# Patient Record
Sex: Male | Born: 1985 | Race: White | Hispanic: No | Marital: Single | Smoking: Former smoker
Health system: Southern US, Community
[De-identification: ages and names within clinical notes are randomized; demographics above are authoritative.]

## PROBLEM LIST (undated history)

## (undated) DIAGNOSIS — I319 Disease of pericardium, unspecified: Secondary | ICD-10-CM

## (undated) HISTORY — PX: TONSILLECTOMY: SUR1361

## (undated) HISTORY — PX: COSMETIC SURGERY: SHX468

## (undated) HISTORY — DX: Disease of pericardium, unspecified: I31.9

## (undated) HISTORY — PX: APPENDECTOMY: SHX54

---
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
## 2009-03-12 ENCOUNTER — Encounter: Payer: Self-pay | Admitting: Cardiovascular Disease

## 2009-03-12 ENCOUNTER — Inpatient Hospital Stay (HOSPITAL_COMMUNITY): Admission: EM | Admit: 2009-03-12 | Discharge: 2009-03-13 | Payer: Self-pay | Admitting: Emergency Medicine

## 2009-05-27 ENCOUNTER — Observation Stay (HOSPITAL_COMMUNITY): Admission: EM | Admit: 2009-05-27 | Discharge: 2009-05-28 | Payer: Self-pay | Admitting: Emergency Medicine

## 2009-05-28 ENCOUNTER — Encounter (INDEPENDENT_AMBULATORY_CARE_PROVIDER_SITE_OTHER): Payer: Self-pay | Admitting: Cardiovascular Disease

## 2009-05-30 ENCOUNTER — Ambulatory Visit: Payer: Self-pay | Admitting: Cardiology

## 2009-05-30 ENCOUNTER — Encounter (INDEPENDENT_AMBULATORY_CARE_PROVIDER_SITE_OTHER): Payer: Self-pay | Admitting: Internal Medicine

## 2009-05-30 ENCOUNTER — Inpatient Hospital Stay (HOSPITAL_COMMUNITY): Admission: EM | Admit: 2009-05-30 | Discharge: 2009-06-03 | Payer: Self-pay | Admitting: Emergency Medicine

## 2009-05-31 ENCOUNTER — Ambulatory Visit: Payer: Self-pay | Admitting: Cardiothoracic Surgery

## 2009-05-31 ENCOUNTER — Encounter (INDEPENDENT_AMBULATORY_CARE_PROVIDER_SITE_OTHER): Payer: Self-pay | Admitting: Cardiology

## 2009-05-31 ENCOUNTER — Encounter (INDEPENDENT_AMBULATORY_CARE_PROVIDER_SITE_OTHER): Payer: Self-pay | Admitting: Emergency Medicine

## 2009-05-31 ENCOUNTER — Encounter: Payer: Self-pay | Admitting: Cardiothoracic Surgery

## 2009-05-31 ENCOUNTER — Ambulatory Visit: Payer: Self-pay | Admitting: Infectious Diseases

## 2009-06-14 ENCOUNTER — Encounter: Admission: RE | Admit: 2009-06-14 | Discharge: 2009-06-14 | Payer: Self-pay | Admitting: Cardiothoracic Surgery

## 2009-06-14 ENCOUNTER — Ambulatory Visit: Payer: Self-pay | Admitting: Cardiothoracic Surgery

## 2009-09-28 HISTORY — PX: OTHER SURGICAL HISTORY: SHX169

## 2011-01-02 LAB — BASIC METABOLIC PANEL
BUN: 11 mg/dL (ref 6–23)
BUN: 11 mg/dL (ref 6–23)
BUN: 9 mg/dL (ref 6–23)
CO2: 25 mEq/L (ref 19–32)
CO2: 29 mEq/L (ref 19–32)
CO2: 29 mEq/L (ref 19–32)
CO2: 32 mEq/L (ref 19–32)
Calcium: 8 mg/dL — ABNORMAL LOW (ref 8.4–10.5)
Calcium: 8.2 mg/dL — ABNORMAL LOW (ref 8.4–10.5)
Calcium: 8.5 mg/dL (ref 8.4–10.5)
Calcium: 9 mg/dL (ref 8.4–10.5)
Chloride: 100 mEq/L (ref 96–112)
Chloride: 102 mEq/L (ref 96–112)
Chloride: 93 mEq/L — ABNORMAL LOW (ref 96–112)
Chloride: 98 mEq/L (ref 96–112)
Creatinine, Ser: 0.6 mg/dL (ref 0.4–1.5)
Creatinine, Ser: 0.6 mg/dL (ref 0.4–1.5)
Creatinine, Ser: 0.62 mg/dL (ref 0.4–1.5)
Creatinine, Ser: 0.67 mg/dL (ref 0.4–1.5)
GFR calc Af Amer: >60 mL/min (ref >60)
GFR calc Af Amer: >60 mL/min (ref >60)
GFR calc Af Amer: >60 mL/min (ref >60)
GFR calc non Af Amer: >60 mL/min (ref >60)
GFR calc non Af Amer: >60 mL/min (ref >60)
GFR calc non Af Amer: >60 mL/min (ref >60)
Glucose, Bld: 100 mg/dL — ABNORMAL HIGH (ref 70–99)
Glucose, Bld: 105 mg/dL — ABNORMAL HIGH (ref 70–99)
Glucose, Bld: 125 mg/dL — ABNORMAL HIGH (ref 70–99)
Glucose, Bld: 92 mg/dL (ref 70–99)
Potassium: 3.7 mEq/L (ref 3.5–5.1)
Potassium: 3.8 mEq/L (ref 3.5–5.1)
Sodium: 135 mEq/L (ref 135–145)
Sodium: 136 mEq/L (ref 135–145)

## 2011-01-02 LAB — CBC
HCT: 35.5 % — ABNORMAL LOW (ref 39.0–52.0)
HCT: 39.3 % (ref 39.0–52.0)
Hemoglobin: 12.2 g/dL — ABNORMAL LOW (ref 13.0–17.0)
Hemoglobin: 13.3 g/dL (ref 13.0–17.0)
Hemoglobin: 13.8 g/dL (ref 13.0–17.0)
MCHC: 34.1 g/dL (ref 30.0–36.0)
MCV: 90.5 fL (ref 78.0–100.0)
MCV: 90.6 fL (ref 78.0–100.0)
Platelets: 189 10*3/uL (ref 150–400)
Platelets: 213 10*3/uL (ref 150–400)
RBC: 4 MIL/uL — ABNORMAL LOW (ref 4.22–5.81)
RBC: 4.35 MIL/uL (ref 4.22–5.81)
RBC: 4.45 MIL/uL (ref 4.22–5.81)
RDW: 13.3 % (ref 11.5–15.5)
RDW: 13.7 % (ref 11.5–15.5)
WBC: 12.3 10*3/uL — ABNORMAL HIGH (ref 4.0–10.5)
WBC: 7.5 10*3/uL (ref 4.0–10.5)
WBC: 8.5 10*3/uL (ref 4.0–10.5)

## 2011-01-02 LAB — BLOOD GAS, ARTERIAL
Acid-Base Excess: 3.6 mmol/L — ABNORMAL HIGH (ref 0.0–2.0)
Bicarbonate: 27.9 mEq/L — ABNORMAL HIGH (ref 20.0–24.0)
O2 Content: 0.2 L/min
O2 Saturation: 96.6 %
Patient temperature: 98.6
TCO2: 29.3 mmol/L (ref 0–100)
pCO2 arterial: 44.8 mmHg (ref 35.0–45.0)
pH, Arterial: 7.41 (ref 7.350–7.450)
pO2, Arterial: 84.2 mmHg (ref 80.0–100.0)

## 2011-01-02 LAB — COMPREHENSIVE METABOLIC PANEL
ALT: 10 U/L (ref 0–53)
ALT: 13 U/L (ref 0–53)
ALT: 14 U/L (ref 0–53)
AST: 16 U/L (ref 0–37)
AST: 16 U/L (ref 0–37)
Albumin: 3 g/dL — ABNORMAL LOW (ref 3.5–5.2)
Alkaline Phosphatase: 49 U/L (ref 39–117)
Alkaline Phosphatase: 52 U/L (ref 39–117)
BUN: 14 mg/dL (ref 6–23)
BUN: 5 mg/dL — ABNORMAL LOW (ref 6–23)
CO2: 26 mEq/L (ref 19–32)
CO2: 28 mEq/L (ref 19–32)
CO2: 29 mEq/L (ref 19–32)
Calcium: 7.8 mg/dL — ABNORMAL LOW (ref 8.4–10.5)
Calcium: 8 mg/dL — ABNORMAL LOW (ref 8.4–10.5)
Chloride: 96 mEq/L (ref 96–112)
Chloride: 99 mEq/L (ref 96–112)
Creatinine, Ser: 0.62 mg/dL (ref 0.4–1.5)
Creatinine, Ser: 0.76 mg/dL (ref 0.4–1.5)
GFR calc Af Amer: >60 mL/min (ref >60)
GFR calc Af Amer: >60 mL/min (ref >60)
GFR calc non Af Amer: >60 mL/min (ref >60)
GFR calc non Af Amer: >60 mL/min (ref >60)
Glucose, Bld: 108 mg/dL — ABNORMAL HIGH (ref 70–99)
Glucose, Bld: 149 mg/dL — ABNORMAL HIGH (ref 70–99)
Potassium: 2.8 mEq/L — ABNORMAL LOW (ref 3.5–5.1)
Potassium: 4.2 mEq/L (ref 3.5–5.1)
Sodium: 135 mEq/L (ref 135–145)
Sodium: 138 mEq/L (ref 135–145)
Sodium: 138 mEq/L (ref 135–145)
Total Bilirubin: 0.6 mg/dL (ref 0.3–1.2)
Total Bilirubin: 1.1 mg/dL (ref 0.3–1.2)
Total Protein: 6.1 g/dL (ref 6.0–8.3)
Total Protein: 6.3 g/dL (ref 6.0–8.3)
Total Protein: 6.5 g/dL (ref 6.0–8.3)

## 2011-01-02 LAB — DIFFERENTIAL
Basophils Absolute: 0 10*3/uL (ref 0.0–0.1)
Basophils Relative: 0 % (ref 0–1)
Lymphocytes Relative: 20 % (ref 12–46)
Monocytes Relative: 10 % (ref 3–12)
Neutro Abs: 8.6 10*3/uL — ABNORMAL HIGH (ref 1.7–7.7)
Neutrophils Relative %: 70 % (ref 43–77)

## 2011-01-02 LAB — AFB CULTURE WITH SMEAR (NOT AT ARMC)
Acid Fast Smear: NONE SEEN
Acid Fast Smear: NONE SEEN
Acid Fast Smear: NONE SEEN

## 2011-01-02 LAB — FUNGUS CULTURE W SMEAR
Fungal Smear: NONE SEEN
Fungal Smear: NONE SEEN
Fungal Smear: NONE SEEN

## 2011-01-02 LAB — URINALYSIS, ROUTINE W REFLEX MICROSCOPIC
Glucose, UA: NEGATIVE mg/dL
pH: 5.5 (ref 5.0–8.0)

## 2011-01-02 LAB — TISSUE CULTURE
Culture: NO GROWTH
Gram Stain: NONE SEEN

## 2011-01-02 LAB — CULTURE, BLOOD (ROUTINE X 2)

## 2011-01-02 LAB — URINE MICROSCOPIC-ADD ON

## 2011-01-02 LAB — PROTIME-INR
INR: 1.2 (ref 0.00–1.49)
Prothrombin Time: 14.7 seconds (ref 11.6–15.2)

## 2011-01-02 LAB — POCT I-STAT 3, ART BLOOD GAS (G3+)
Acid-Base Excess: 1 mmol/L (ref 0.0–2.0)
Bicarbonate: 26.6 mEq/L — ABNORMAL HIGH (ref 20.0–24.0)
O2 Saturation: 97 %
Patient temperature: 97.9
TCO2: 28 mmol/L (ref 0–100)
pCO2 arterial: 43.1 mmHg (ref 35.0–45.0)
pH, Arterial: 7.396 (ref 7.350–7.450)
pO2, Arterial: 89 mmHg (ref 80.0–100.0)

## 2011-01-02 LAB — BODY FLUID CULTURE
Culture: NO GROWTH
Culture: NO GROWTH
Gram Stain: NONE SEEN
Gram Stain: NONE SEEN

## 2011-01-02 LAB — ANAEROBIC CULTURE
Gram Stain: NONE SEEN
Gram Stain: NONE SEEN

## 2011-01-02 LAB — HEPATITIS PANEL, ACUTE
HCV Ab: NEGATIVE
HCV Ab: NEGATIVE
Hep A IgM: NEGATIVE
Hep B C IgM: NEGATIVE
Hep B C IgM: NEGATIVE
Hepatitis B Surface Ag: NEGATIVE
Hepatitis B Surface Ag: NEGATIVE

## 2011-01-02 LAB — SEDIMENTATION RATE: Sed Rate: 30 mm/hr — ABNORMAL HIGH (ref 0–16)

## 2011-01-02 LAB — LACTIC ACID, PLASMA: Lactic Acid, Venous: 1.8 mmol/L (ref 0.5–2.2)

## 2011-01-02 LAB — COXSACKIE A VIRUS ANTIBODIES: Coxsackie A Sero 9: 1:8 {titer}

## 2011-01-02 LAB — ECHOVIRUS ABS PANEL (CSF)
Echovirus Ab Type 11: <1:10 {titer}
Echovirus Ab Type 30: <1:10 {titer}
Echovirus Ab Type 6: <1:10 {titer}
Echovirus Ab Type 7: <1:10 {titer}
Echovirus Ab Type 9: 1:20 {titer}

## 2011-01-02 LAB — MAGNESIUM: Magnesium: 2.2 mg/dL (ref 1.5–2.5)

## 2011-01-02 LAB — COMPREHENSIVE METABOLIC PANEL WITH GFR
AST: 24 U/L (ref 0–37)
Albumin: 3 g/dL — ABNORMAL LOW (ref 3.5–5.2)
Alkaline Phosphatase: 54 U/L (ref 39–117)
Chloride: 100 meq/L (ref 96–112)
GFR calc Af Amer: >60 mL/min (ref >60)
Potassium: 3.5 meq/L (ref 3.5–5.1)
Total Bilirubin: 1.1 mg/dL (ref 0.3–1.2)
Total Protein: 6.1 g/dL (ref 6.0–8.3)

## 2011-01-02 LAB — CK TOTAL AND CKMB (NOT AT ARMC)
CK, MB: 1 ng/mL (ref 0.3–4.0)
CK, MB: 2.1 ng/mL (ref 0.3–4.0)
Total CK: 395 U/L — ABNORMAL HIGH (ref 7–232)
Total CK: 604 U/L — ABNORMAL HIGH (ref 7–232)
Total CK: 659 U/L — ABNORMAL HIGH (ref 7–232)

## 2011-01-02 LAB — ANTI-DNA ANTIBODY, DOUBLE-STRANDED: ds DNA Ab: <1 IU/mL (ref <5)

## 2011-01-02 LAB — TYPE AND SCREEN
ABO/RH(D): O NEG
Antibody Screen: NEGATIVE

## 2011-01-02 LAB — EPSTEIN-BARR VIRUS VCA ANTIBODY PANEL: EBV VCA IgM: 0.07 {ISR}

## 2011-01-02 LAB — ABO/RH: ABO/RH(D): O NEG

## 2011-01-02 LAB — MISCELLANEOUS TEST

## 2011-01-02 LAB — CMV ABS, IGG+IGM (CYTOMEGALOVIRUS)
CMV IgM: <8 AU/mL (ref <30.0)
Cytomegalovirus Ab-IgG: <0.2 U/mL (ref <0.4)

## 2011-01-02 LAB — ANA
Anti Nuclear Antibody(ANA): NEGATIVE
Anti Nuclear Antibody(ANA): NEGATIVE

## 2011-01-02 LAB — APTT: aPTT: 33 seconds (ref 24–37)

## 2011-01-02 LAB — TROPONIN I: Troponin I: 0.02 ng/mL (ref 0.00–0.06)

## 2011-01-02 LAB — BRAIN NATRIURETIC PEPTIDE: Pro B Natriuretic peptide (BNP): 91 pg/mL (ref 0.0–100.0)

## 2011-01-03 LAB — SEDIMENTATION RATE: Sed Rate: 6 mm/hr (ref 0–16)

## 2011-01-03 LAB — ANA: Anti Nuclear Antibody(ANA): NEGATIVE

## 2011-01-03 LAB — LIPID PANEL
Cholesterol: 95 mg/dL (ref 0–200)
LDL Cholesterol: 52 mg/dL (ref 0–99)
Triglycerides: 35 mg/dL (ref <150)

## 2011-01-03 LAB — CULTURE, BLOOD (ROUTINE X 2)

## 2011-01-03 LAB — DIFFERENTIAL
Basophils Absolute: 0.1 10*3/uL (ref 0.0–0.1)
Basophils Relative: 1 % (ref 0–1)
Eosinophils Relative: 0 % (ref 0–5)
Lymphocytes Relative: 21 % (ref 12–46)
Lymphocytes Relative: 22 % (ref 12–46)
Lymphs Abs: 2.4 10*3/uL (ref 0.7–4.0)
Monocytes Relative: 8 % (ref 3–12)
Neutro Abs: 7.9 10*3/uL — ABNORMAL HIGH (ref 1.7–7.7)
Neutrophils Relative %: 68 % (ref 43–77)

## 2011-01-03 LAB — BASIC METABOLIC PANEL
BUN: 13 mg/dL (ref 6–23)
CO2: 28 mEq/L (ref 19–32)
Chloride: 100 mEq/L (ref 96–112)
Creatinine, Ser: 0.73 mg/dL (ref 0.4–1.5)
Glucose, Bld: 127 mg/dL — ABNORMAL HIGH (ref 70–99)
Potassium: 3.7 mEq/L (ref 3.5–5.1)

## 2011-01-03 LAB — CBC
HCT: 38.4 % — ABNORMAL LOW (ref 39.0–52.0)
HCT: 44.2 % (ref 39.0–52.0)
Hemoglobin: 13.4 g/dL (ref 13.0–17.0)
Hemoglobin: 15 g/dL (ref 13.0–17.0)
MCV: 89.4 fL (ref 78.0–100.0)
Platelets: 146 10*3/uL — ABNORMAL LOW (ref 150–400)
Platelets: 160 10*3/uL (ref 150–400)
RBC: 4.87 MIL/uL (ref 4.22–5.81)
RDW: 13.1 % (ref 11.5–15.5)
WBC: 11.4 10*3/uL — ABNORMAL HIGH (ref 4.0–10.5)
WBC: 11.5 10*3/uL — ABNORMAL HIGH (ref 4.0–10.5)

## 2011-01-03 LAB — CARDIAC PANEL(CRET KIN+CKTOT+MB+TROPI)
Relative Index: 0.6 (ref 0.0–2.5)
Troponin I: 0.02 ng/mL (ref 0.00–0.06)

## 2011-01-03 LAB — COMPREHENSIVE METABOLIC PANEL
Albumin: 3.3 g/dL — ABNORMAL LOW (ref 3.5–5.2)
Alkaline Phosphatase: 48 U/L (ref 39–117)
BUN: 9 mg/dL (ref 6–23)
Chloride: 107 mEq/L (ref 96–112)
Creatinine, Ser: 0.81 mg/dL (ref 0.4–1.5)
Glucose, Bld: 141 mg/dL — ABNORMAL HIGH (ref 70–99)
Potassium: 3.3 mEq/L — ABNORMAL LOW (ref 3.5–5.1)
Total Bilirubin: 1.1 mg/dL (ref 0.3–1.2)

## 2011-01-03 LAB — URINALYSIS, ROUTINE W REFLEX MICROSCOPIC
Glucose, UA: NEGATIVE mg/dL
Ketones, ur: 15 mg/dL — AB
pH: 5.5 (ref 5.0–8.0)

## 2011-01-03 LAB — POCT CARDIAC MARKERS: Myoglobin, poc: 308 ng/mL (ref 12–200)

## 2011-01-03 LAB — URINE MICROSCOPIC-ADD ON

## 2011-01-05 LAB — RAPID URINE DRUG SCREEN, HOSP PERFORMED
Barbiturates: NOT DETECTED
Benzodiazepines: NOT DETECTED
Cocaine: NOT DETECTED
Opiates: POSITIVE — AB

## 2011-01-05 LAB — DIFFERENTIAL
Basophils Relative: 0 % (ref 0–1)
Eosinophils Absolute: 0 10*3/uL (ref 0.0–0.7)
Monocytes Relative: 8 % (ref 3–12)
Neutrophils Relative %: 85 % — ABNORMAL HIGH (ref 43–77)

## 2011-01-05 LAB — HEPATIC FUNCTION PANEL
ALT: 18 U/L (ref 0–53)
Alkaline Phosphatase: 75 U/L (ref 39–117)
Indirect Bilirubin: 0.7 mg/dL (ref 0.3–0.9)
Total Bilirubin: 1 mg/dL (ref 0.3–1.2)
Total Protein: 7.2 g/dL (ref 6.0–8.3)

## 2011-01-05 LAB — POCT CARDIAC MARKERS: Troponin i, poc: <0.05 ng/mL (ref 0.00–0.09)

## 2011-01-05 LAB — BASIC METABOLIC PANEL
BUN: 8 mg/dL (ref 6–23)
CO2: 27 mEq/L (ref 19–32)
Calcium: 9.4 mg/dL (ref 8.4–10.5)
Calcium: 8.5 mg/dL (ref 8.4–10.5)
Chloride: 106 mEq/L (ref 96–112)
Creatinine, Ser: 0.64 mg/dL (ref 0.4–1.5)
GFR calc Af Amer: >60 mL/min (ref >60)
GFR calc non Af Amer: >60 mL/min (ref >60)
Sodium: 139 mEq/L (ref 135–145)

## 2011-01-05 LAB — CBC
MCHC: 34.3 g/dL (ref 30.0–36.0)
MCHC: 34.3 g/dL (ref 30.0–36.0)
MCV: 89.1 fL (ref 78.0–100.0)
MCV: 88.5 fL (ref 78.0–100.0)
Platelets: 185 10*3/uL (ref 150–400)
Platelets: 181 10*3/uL (ref 150–400)
RDW: 13.2 % (ref 11.5–15.5)

## 2011-01-05 LAB — TROPONIN I: Troponin I: 0.01 ng/mL (ref 0.00–0.06)

## 2011-01-05 LAB — D-DIMER, QUANTITATIVE: D-Dimer, Quant: 0.51 ug/mL-FEU — ABNORMAL HIGH (ref 0.00–0.48)

## 2011-01-05 LAB — CK TOTAL AND CKMB (NOT AT ARMC)
CK, MB: 1.9 ng/mL (ref 0.3–4.0)
CK, MB: 1.3 ng/mL (ref 0.3–4.0)
Relative Index: INVALID (ref 0.0–2.5)
Relative Index: INVALID (ref 0.0–2.5)
Total CK: 64 U/L (ref 7–232)
Total CK: 140 U/L (ref 7–232)

## 2011-01-05 LAB — RHEUMATOID FACTOR: Rhuematoid fact SerPl-aCnc: <20 IU/mL (ref 0–20)

## 2011-02-10 NOTE — Discharge Summary (Signed)
[name], [name] NO.:  [id number]   MEDICAL RECORD NO.:  [id number]          PATIENT TYPE:  INP   LOCATION:  2031                         FACILITY:  [facility]   PHYSICIAN:  Nicki Guadalajara, M.D.     DATE OF BIRTH:  19-Jul-1986   DATE OF ADMISSION:  05/27/2009  DATE OF DISCHARGE:  05/28/2009                               DISCHARGE SUMMARY   DISCHARGE DIAGNOSES:  1. Pericarditis with pericardial effusion, small to moderate, free      flowing, anterior to the heart along the left ventricular free      wall.  There was no evidence of hemodynamic compromise.  2. Left ventricular dysfunction with ejection fraction of 40-45%.  3. Mild mitral regurgitation.  4. Urinary tract infection, placed on Cipro.  5. Fever, presumed secondary to urinary tract infection with blood      cultures pending.  6. Ureteral stone seen on KUB.  7. Hypokalemia, replaced.   DISCHARGE CONDITION:  Improved.  No further chest pain.   HISTORY OF PRESENT ILLNESS:  A 25 year old white male with history of  pectus reconstruction surgery at [facility] at age 31.  He was admitted in June  2010 with chest pain, had abnormal EKG, diagnosed with pericarditis.  His symptoms resolved with nonsteroidals.  This past Saturday, May 25, 2009, he began developing substernal chest pain, worse with deep  breath and worse lying flat.  Dr. Cleta Alberts saw him at Urgent Care and he was  given Toradol and Phenergan with drop of blood pressure from 110  systolic to 90 systolic and he was sent to the emergency room.   PAST MEDICAL HISTORY:  Otherwise negative.   OUTPATIENT MEDICATIONS:  Darvocet and colchicine p.r.n.   ALLERGIES:  No known allergies.   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   FAMILY HISTORY:  Negative for coronary.   The patient was monitored overnight.  He did develop a fever of 101.5.  UA was sent.  He had a chest x-ray prior to admission through Dr. Cleta Alberts  that was normal and urinalysis was positive for nitrites.  He was placed  on Cipro.  I do not have the urinary culture back at this time.   The patient also had 2-D echo completed and does have pericardial  effusion and EF is now 40-45%, down from 50-55%.  A low-dose ACE  inhibitor was added.  Plans were to discharge the patient on May 29, 2009, but Dr. Garen Lah talked to him and the patient preferred to be  discharged today and Dr. Garen Lah was agreeable to the plan.  He will  follow up with Dr. Tresa Endo.   DISCHARGE VITAL SIGNS:  Blood pressure was 116/72, temp was now 97,  pulse 77, respirations 20, and oxygen saturation on room air was 98%.   LABORATORY DATA:  Sed rate was 6.  ANA was negative.   Initial CBC:  Hemoglobin of 13.4, hematocrit 38.4, WBC 11.5, and  platelets 146.  On the day of discharge, WBC 11.4, hemoglobin 15,  hematocrit 44.2, and  platelets 160.   Blood cultures are pending at discharge.  Cholesterol panel:  Total  cholesterol 95, triglycerides 35, HDL 36, and LDL 52.  Basic metabolic  at discharge:  Sodium 138, potassium 3.7, chloride 100, CO2 of 28,  glucose 127, BUN 13, creatinine 0.73, and calcium 9.   Cardiac panels:  Total CK was 706, MB was negative at 4.5, and troponin  I of 0.02.   UA was orange in color, negative glucose, small bilirubin, ketone 15,  protein 30, urobilinogen was 1, and nitrites were positive and trace  leukocytes.  Microscopic UA:  Wbc 3.6, rbc 0-2, and few bacteria were  seen.   EKGs with nonspecific lateral changes.   The patient was placed on Indocin and Toradol IV on admission and  resolved his chest pain.  On the morning of May 28, 2009, he had no  complaints except for left flank pain that was very tender.  We did do a  KUB which was negative for stone.  EKG remained sinus rhythm.  A 2-D  echo was done with results as stated and he was seen and discharged by  Dr. Garen Lah on May 28, 2009, with a low-dose ACE  inhibitor,  nonsteroidals with Indocin and Darvocet p.r.n. as well as low-dose ACE  inhibitor.  Please see Med Rec for discharge medications.  He will  follow up with Dr. Tresa Endo as an outpatient and we asked him not to work  until June 04, 2009.  The office will call and give him the date and  time of the appointment.  No restrictions on diet and just to increase  his activity slowly.      Darcella Gasman. Annie Paras, N.P.    ______________________________  Nicki Guadalajara, M.D.    LRI/MEDQ  D:  05/28/2009  T:  05/29/2009  Job:  [id number]   cc:   Brett Canales A. Cleta Alberts, M.D.

## 2011-02-10 NOTE — H&P (Signed)
[name], [name]                 ACCOUNT NO.:  [id number]   MEDICAL RECORD NO.:  [id number]          PATIENT TYPE:  INP   LOCATION:  2610                         FACILITY:  [facility]   PHYSICIAN:  [name], M.D.   DATE OF BIRTH:  Feb 02, 1986   DATE OF ADMISSION:  05/29/2009  DATE OF DISCHARGE:                              HISTORY & PHYSICAL   PRIMARY CARE PHYSICIAN:  None.  The patient goes to [facility]  for medical needs when necessary.   CARDIOLOGIST:  Timothy Guadalajara, MD with [facility].   CHIEF COMPLAINT:  Abdominal pain, diffuse; nausea and vomiting,  intractable.   HISTORY OF PRESENT ILLNESS:  The patient is a 25 year old male who was  recently hospitalized on May 27, 2009, through May 28, 2009, for  treatment of pericarditis and pericardial effusion.  The patient has a  past medical history of recurrent problems with this and had a bout in  2005 and again in June 2010.  He was discharged home on a combination of  indomethacin, lisinopril for systolic dysfunction noted on two-  dimensional echocardiography, and Cipro for treatment of urinary tract  infection.  The patient developed intractable nausea and vomiting after  discharge.  There has not been any associated diarrhea.  He denies any  melena or hematochezia.  He has had some mild fever and chills.  The  patient states that he has vomited 12-15 times in the past 24 hours and  the vomiting has become bilious in nature.  He had an episode of  witnessed vomiting in the emergency department with flecks of blood.  He  has had some dyspnea and is sitting up to help with the pain and his  breathing.  Denies any cough.  Upon initial evaluation in the emergency  department, a CT scan of his abdomen and pelvis were obtained, which  showed a moderate-to-large pericardial effusion, increased significantly  since prior study, and extensive periportal edema.  The Hospitalist  Service was asked to  admit the patient for evaluation of the extensive  periportal edema with Cardiology consulting for recurrent pericarditis  with worsening pericardial effusion.   PAST MEDICAL HISTORY:  1. Recurrent pericarditis  2. Left ventricular dysfunction with an ejection fraction of 40% to      45% on two-dimensional echocardiogram done on May 28, 2009.      There was diffuse hypokinesis.  No evidence of hemodynamic      compromise or clinically significant tamponade.  3. Mild mitral valvular regurgitation.  4. Nephrolithiasis, nonobstructing.  5. Status post appendectomy.  6. Status post multiple pectus excavatum surgeries at age 14, 17, 45,      and 42.  7. Laryngotomy.  8. Tonsillectomy.   FAMILY HISTORY:  The patient's mother is alive at age 54 and has thyroid  disease.  The patient's father is alive at 17 and has asthma.  He has 2  healthy siblings.   SOCIAL HISTORY:  The patient is single and works as a Airline pilot.  He is  attempting to quit smoking and now has cut  down to one cigarette every  other day.  He drinks alcohol rarely, 1-2 beers per month.  Denies any  drug use.  He finished a sophomore year of college.   ALLERGIES:  No known drug allergies.   CURRENT MEDICATIONS:  1. Ciprofloxacin 500 mg p.o. b.i.d.  2. Indomethacin 50 mg p.o. t.i.d.  3. Lisinopril 2.5 mg p.o. daily.  4. Darvocet-N 100 one tablet p.o. q.6 h. p.r.n.   REVIEW OF SYSTEMS:  The patient reports pain from his hips to his ribs  bilaterally that he described is constant with a crampy, achy, and sharp  sensation.  A comprehensive 14-point review of systems is otherwise  negative except for the elements as noted in the HPI above.   PHYSICAL EXAMINATION:  VITAL SIGNS:  Temperature 97.4, pulse 94,  respirations 18, blood pressure 114/70, O2 saturation 92% on room air.  GENERAL:  This is an uncomfortable-appearing male in mild distress.  HEENT:  Normocephalic, atraumatic.  PERRL.  EOMI.  Oropharynx is clear.   Sclerae nonicteric.  Mucous membranes are moist.  Tympanic membranes are  normal.  NECK:  Supple, no thyromegaly, no lymphadenopathy.  There is jugular  venous distention.  CHEST:  The patient has crackles to his left base.  HEART:  Regular rate, rhythm.  No murmurs, rubs, or gallops.  Again,  there is JVD.  ABDOMEN:  Tender, mildly distended.  He does have positive bowel sounds.  EXTREMITIES:  No clubbing, edema, or cyanosis.  SKIN:  Warm and dry.  No rashes.  NEUROLOGIC:  The patient is alert and oriented x3.  Nonfocal.   DATA REVIEW:  CT scan shows a moderate-to-large pericardial effusion,  small right pleural effusions, bibasilar atelectasis, and extensive  periportal edema.   LABORATORY DATA:  Lipase is less than 10, lactic acid is 1.8.  CK is 659  with an MB of 2.1.  White blood cell count is 12.3, hemoglobin 13.8,  hematocrit 40.2, platelets 168.  Sodium is 135, potassium 3.5, chloride  100, bicarb 26, BUN 14, creatinine 0.62, glucose 108.  Liver function  studies are completely within normal limits.  Albumin is slightly low at  3.0.  Calcium is 7.8.  Urinalysis shows a specific gravity of 1.029.  There is moderate bilirubin, 40 mg/dL of ketones, negative nitrites,  trace leukocytes.  Troponin is 0.02.   ASSESSMENT/PLAN:  1. Recurrent moderate-to-severe pericardial effusion/pericarditis:      Given the patient's mild hematemesis, I will hold nonsteroidal anti-      inflammatory medications for now and leave the decision as to      whether or not to consider steroids to Cardiology.  The patient may      need a pericardiocentesis.  There is no evidence of tamponade at      the present time.  2. Intractable nausea and vomiting:  This could be due to passive      liver congestion versus gastritis from nonsteroidal anti-      inflammatory use.  At this point, we will hold the nonsteroidals      and I have given him a one-time dose of Lasix to see if this      improves his  symptoms.  The patient has no known history of liver      disease.  He does not have any elevation of his liver function      studies.  3. Periportal edema:  Uncertain etiology.  Again, passive congestion      of  the liver is in the differential along with hepatitis.  Given      the fact that he has no elevation of liver function studies,      hepatitis is not likely.  Nevertheless, an acute hepatitis panel      has been sent.  We will get a right upper quadrant ultrasound to      more fully evaluate.  4. Mild hematemesis:  Likely due to a small Mallory Weiss tear given      the severity of his nausea and vomiting.  Place the patient on      proton pump inhibitor therapy and hold the nonsteroidals for now.      We will check a hemoglobin and hematocrit in the morning to ensure      that he is not experiencing any significant decrease blood volume.  5. Prophylaxis:  We will use PAS hoses for DVT prophylaxis and place      the patient on proton pump inhibitor therapy as noted.  6. Recent urinary tract infection:  Unfortunately, there is no culture      data prior to the initiation of antibiotic therapy.  Given his      intractable nausea and vomiting, we will change him to Rocephin 1 g      IV daily.   Time spent gathering data, analyzing data, interviewing the patient,  examining the patient, and formulating plan of care approximately equal  to 1 hour 10 minutes.      [name], M.D.  Electronically Signed     CR/MEDQ  D:  05/30/2009  T:  05/30/2009  Job:  [id number]   cc:   [facility]  [name], M.D.

## 2011-02-10 NOTE — H&P (Signed)
NAMESILVANO, Payne NO.:  [id number]   MEDICAL RECORD NO.:  [id number]          PATIENT TYPE:  EMS   LOCATION:  MAJO                         FACILITY:  [facility]   PHYSICIAN:  Antonieta Iba, MD   DATE OF BIRTH:  1986/06/19   DATE OF ADMISSION:  03/12/2009  DATE OF DISCHARGE:                              HISTORY & PHYSICAL   [name] is a 25 year old we are asked to seen by the ER MD for  probable pericarditis.  He has a prior history of pericarditis in 2005,  treated by [facility].  He also has a history of pectus  excavatum reconstructive surgery at ages 32 and 52 and 34 and 35, he  states at [facility].  He states Sunday, he had a sudden onset of  chest pain progressively worsening located bilateral chest from the  epigastric area up to approximately the nipple area all the way across  his chest in a band-like fashion.  His pain started as a dull, then an  aching to sharp and pressure and he had shortness of breath with this.  It was worse with positional changes.  It became so severe, he came to  the emergency room today.  His labs revealed his sodium was 139,  potassium was 3.1.  His BUN was 8, his creatinine was 0.64, his glucose  was 131.  His D-dimer was elevated at 0.51.  His hemoglobin was 15.2.  His hematocrit was 44.4.  His WBCs were 11.9 and his platelets were 181.  His EKG did show ST elevation in V2-V6.  He had a chest x-ray that  appears to be cardiomegaly.  He had a CT that was negative for any  pulmonary embolus, but it did show possible pericardial effusion.  In  the emergency room, he was given 4 mg of morphine at 2:45 with 324 mg of  aspirin and 800 mg of ibuprofen about 3:30, and he had two of Dilaudid  at 4:00 p.m.  He was pain free when I saw him at about 6:00 a.m.   PAST MEDICAL HISTORY:  1. Pericarditis in 2005.  2. Pectus excavatum reconstructive surgeries age 7 and 8 and in 16 and      18 .  3. Appendectomy rupture at age  9.  4. Laryngotomy.  5. Tonsillectomy.   MEDICATIONS AT HOME:  None.   ALLERGIES:  NKDA.   FAMILY HISTORY:  Mother is alive at age 58.  She has thyroid issues.  Father is alive at age 72, he has asthma.  One brother age 24, one  sister age 28.  Paternal grandfather had hypertension, paternal  grandmother has hypertension, they are both alive.  Maternal grandfather  had a coronary bypass in his 51s.  Maternal grandmother also had CABG in  her 34s. I believe she is deceased.   SOCIAL HISTORY:  Single.  He has smoked for about 4 years, one pack  every 2-3 days.  He runs three to four miles three to four times per  week.  He occasionally uses alcohol.  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  He has no IV drug use.   REVIEW OF SYSTEMS:  No palpitations, no presyncope, no syncope.  Positive shortness of breath.  Positive chest pain.  Positive dyspnea on  exertion.  No GERD.  No fever at home.  He did have a fever the  emergency room.  No chills, no cough, no congestion, no swelling, no  diarrhea, no black stools.  Other symptoms are negative.   PHYSICAL EXAMINATION:  VITAL SIGNS:  115/59, heart rate was 97-85,  respirations 22, temperature is 100.  GENERAL:  He is A thin-appearing male.  HEENT:  Pupils equal and round.  Pharynx is normal.  CHEST:  Respirations are clear.  He has fair inspiratory effort.  He  does have pain with deep inspiration and he has several old surgical  scars across his chest.  His chest wall is thin.  CARDIOVASCULAR:  He has a two-component rub, positive S2.  Difficult to  hear S1.  GI:  Bowel sounds are present x4.  EXTREMITIES:  Moves all extremities x4.  Lower Extremities, no edema.  1+ dorsalis pedis pulses.  NEURO:  He is alert and oriented x3.  He is sleepy from narcotics.  He  has no focal deficits.  SKIN:  Warm and dry.  No lesions.   ASSESSMENT:  1. Pericarditis.  This is the second episode in 5 years, questionable       pericardial effusion.  2. History of pectus excavatum reconstructive surgery for hypokalemia.      Admit for observation, check 2-D echo for pericardial effusion.  He      was treated with multiple medications in the emergency room.  We      will continue his ibuprofen and narcotics for pain relief.  I      ordered ESR, CRP, TSH, magnesium and liver function tests.      Questionable additional labs needed and I have given him 40 mEq of      potassium.      Lezlie Octave, N.P.      Antonieta Iba, MD  Electronically Signed    BB/MEDQ  D:  03/12/2009  T:  03/12/2009  Job:  [id number]

## 2011-02-10 NOTE — Discharge Summary (Signed)
[name], [name]                 ACCOUNT NO.:  [id number]   MEDICAL RECORD NO.:  [id number]          PATIENT TYPE:  INP   LOCATION:  6523                         FACILITY:  [facility]   PHYSICIAN:  Antonieta Iba, MD   DATE OF BIRTH:  1985-11-02   DATE OF ADMISSION:  03/12/2009  DATE OF DISCHARGE:  03/13/2009                               DISCHARGE SUMMARY   DISCHARGE DIAGNOSES:  1. Pericarditis.  2. History of pectus excavatum reconstructive surgery at age 83 and at      age 82.   HOSPITAL COURSE:  The patient is a 25 year old male who is followed at  [facility].  We were asked to see for possible pericarditis.  He  has a past history of pericarditis in 2005.  He has had pectus excavatum  reconstructive surgery twice in his life.  The patient developed  substernal chest pain, which progressively worsened over the 2 days  prior to admission.  He presented to emergency room.  CT scan was  negative for pulmonary embolism, but there was a question of a  pericardial effusion.  His EKG showed ST elevations in leads V2 through  V6.  The patient was seen by Dr. Clarene [facility].  He was admitted for  observation.  We started him on ibuprofen.  His symptoms improved with  ibuprofen.  Echocardiogram was done, which showed small circumflex  pericardial effusion with no tamponade.  He did have a 2-component  friction rub.  CK-MBs were negative.  His troponin was 0.1.  We feel he  can be discharged March 13, 2009.  He will follow up with Dr. Lewie Loron as an  outpatient.   LABORATORY DATA:  White count 18.7, hemoglobin 14.9, hematocrit 43.5,  platelets 185.  Sodium 139, potassium 4.1, BUN 17, creatinine 0.8.  CK-  MB and troponins are initially negative, although his last troponin was  0.1.  TSH is 1.6.  C-reactive protein is 5.7.  Drug screens were  positive for THC and opiates.  Rheumatoid factor is less than 20.  Liver  functions were normal.  Chest x-ray showed chest wall deformity  attributed to  pectus, no acute findings.   DISCHARGE MEDICATIONS:  1. Ibuprofen 800 mg p.o. t.i.d. for 2 weeks.  2. Colchicine 0.6 mg p.o. b.i.d. for 2 weeks.  3. Darvocet-N 100 one-two q.6 h. P.r.n.   DISPOSITION:  The patient discharged in stable condition.  He will need  to stay out of work for a week.  We will see him back in the office in a  couple weeks.      Abelino Derrick, P.A.      Antonieta Iba, MD  Electronically Signed    LKK/MEDQ  D:  03/13/2009  T:  03/13/2009  Job:  [id number]   cc:   [facility]

## 2011-02-10 NOTE — Assessment & Plan Note (Signed)
OFFICE VISIT   [name], [name]  DOB:  1985-12-01                                        June 14, 2009  CHART #:  [id number]   CURRENT PROBLEMS:  1. Status post subxiphoid pericardial window for effusive      pericarditis, cultures and cytology negative.  2. Status post repair of pectus excavatum at age 25 and 25 years.   PRESENT ILLNESS:  The patient is a 25 year old Caucasian male who  returns for his office visit 2 weeks after undergoing an urgent  subxiphoid pericardial window for drainage of a moderate pericardial  effusion with evidence of pre-tamponade on echo.  The appearance of the  pericardium and epicardium was typical fibrinous exudate with a clear  effusion.  He has done well since surgery and his symptoms have  resolved.  The surgical incision is well healed.  He has finished a  course of oral antibiotics including Avelox and doxycycline.  He is soon  to finish a short course of prednisone taper.   PHYSICAL EXAMINATION:  Vital Signs:  Blood pressure 110/65, pulse 83 and  regular, respirations 18, saturation 99%.  General:  He is alert and  pleasant.  Lungs:  Breath sounds are clear.  Chest:  The substernal  incision is well healed.  The chest tube sutures removed.  Cardiac:  There is no friction rub or gallop on the cardiac exam.  Extremities:  There is no peripheral edema and peripheral pulses are all intact.   PA and lateral chest x-ray shows no evidence of cardiac enlargement or  pleural effusion.   PLAN:  The patient will return to his activities and will be able to  resume working within 48 hours.  He was cautioned not to lift more than  25 pounds from the next 2 weeks.  He will return as necessary.   Kerin Perna, M.D.  Electronically Signed   PV/MEDQ  D:  06/14/2009  T:  06/15/2009  Job:  [id number]   cc:   Ritta Slot, MD

## 2012-08-20 ENCOUNTER — Ambulatory Visit (INDEPENDENT_AMBULATORY_CARE_PROVIDER_SITE_OTHER): Payer: 59 | Admitting: Family Medicine

## 2012-08-20 ENCOUNTER — Ambulatory Visit: Payer: 59

## 2012-08-20 VITALS — BP 110/68 | HR 64 | Temp 97.5°F | Resp 16 | Ht 69.5 in | Wt 136.0 lb

## 2012-08-20 DIAGNOSIS — S0003XA Contusion of scalp, initial encounter: Secondary | ICD-10-CM

## 2012-08-20 DIAGNOSIS — R110 Nausea: Secondary | ICD-10-CM

## 2012-08-20 DIAGNOSIS — S0083XA Contusion of other part of head, initial encounter: Secondary | ICD-10-CM

## 2012-08-20 DIAGNOSIS — R51 Headache: Secondary | ICD-10-CM

## 2012-08-20 MED ORDER — IBUPROFEN 800 MG PO TABS: 800.0000 mg | ORAL_TABLET | Freq: Three times a day (TID) | ORAL | Status: DC | PRN

## 2012-08-20 MED ORDER — KETOROLAC TROMETHAMINE 60 MG/2ML IM SOLN
60.0000 mg | Freq: Once | INTRAMUSCULAR | Status: AC
  Administered 2012-08-20: 60 mg via INTRAMUSCULAR

## 2012-08-20 MED ORDER — PROMETHAZINE HCL 25 MG PO TABS: 25.0000 mg | ORAL_TABLET | Freq: Three times a day (TID) | ORAL | Status: DC | PRN
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
## 2013-07-01 ENCOUNTER — Ambulatory Visit (INDEPENDENT_AMBULATORY_CARE_PROVIDER_SITE_OTHER): Payer: 59 | Admitting: Physician Assistant

## 2013-07-01 VITALS — BP 115/78 | HR 79 | Temp 98.1°F | Resp 16 | Ht 68.75 in | Wt 138.0 lb

## 2013-07-01 DIAGNOSIS — Z2089 Contact with and (suspected) exposure to other communicable diseases: Secondary | ICD-10-CM

## 2013-07-01 DIAGNOSIS — Z207 Contact with and (suspected) exposure to pediculosis, acariasis and other infestations: Secondary | ICD-10-CM

## 2013-07-01 NOTE — Progress Notes (Signed)
  Subjective:    Patient ID: [name], male    DOB: [birth year], 27 y.o.   MRN: [id number]  HPI 27 year old male presents for evaluation of possible exposure to scabies. States his roommate was diagnosed with it on 06/27/13 and treated.  His roommates girlfriend was also diagnosed and treated. They have since washed and cleaned all common spaces including throwing out their sofa and chairs.  Patient states he has had no appearance of a rash and denies any pruritis. Had scabies 3 years ago so is familiar with the symptoms.  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  Patient is completely asymptomatic.     Review of Systems  Constitutional: Negative for fever and chills.  Skin: Negative for color change and rash.  Neurological: Negative for dizziness.       Objective:   Physical Exam  Constitutional: He is oriented to person, place, and time. He appears well-developed and well-nourished.  HENT:  Head: Normocephalic and atraumatic.  Right Ear: External ear normal.  Left Ear: External ear normal.  Eyes: Conjunctivae are normal.  Neck: Normal range of motion.  Cardiovascular: Normal rate.   Pulmonary/Chest: Effort normal and breath sounds normal.  Neurological: He is alert and oriented to person, place, and time.  Skin: Skin is warm and dry. No lesion and no rash noted.  Psychiatric: He has a normal mood and affect. His behavior is normal. Judgment and thought content normal.          Assessment & Plan:  Exposure to scabies  Although he has had a potential exposure to scabies, he is asymptomatic at this time so I do not think treatment is necessary.   Ok to send rx for Permethrin cream if rash develops. He will need a note out of work if this occurs.

## 2014-05-25 ENCOUNTER — Encounter (HOSPITAL_COMMUNITY): Payer: Self-pay | Admitting: Emergency Medicine

## 2014-05-25 DIAGNOSIS — Z8679 Personal history of other diseases of the circulatory system: Secondary | ICD-10-CM | POA: Insufficient documentation

## 2014-05-25 DIAGNOSIS — R079 Chest pain, unspecified: Secondary | ICD-10-CM | POA: Insufficient documentation

## 2014-05-25 NOTE — ED Notes (Signed)
Pt. reports intermittent /progressing mid chest pain onset Tuesday last week , denies SOB /nausea or diaphoresis .

## 2014-05-26 ENCOUNTER — Emergency Department (HOSPITAL_COMMUNITY): Payer: BC Managed Care – PPO

## 2014-05-26 ENCOUNTER — Emergency Department (HOSPITAL_COMMUNITY)
Admission: EM | Admit: 2014-05-26 | Discharge: 2014-05-26 | Disposition: A | Discharge status: Home / Self Care | Payer: BC Managed Care – PPO | Attending: Emergency Medicine | Admitting: Emergency Medicine

## 2014-05-26 DIAGNOSIS — R079 Chest pain, unspecified: Secondary | ICD-10-CM | POA: Diagnosis not present

## 2014-05-26 LAB — CBC
HEMATOCRIT: 40.1 % (ref 39.0–52.0)
Hemoglobin: 14.2 g/dL (ref 13.0–17.0)
MCH: 30.3 pg (ref 26.0–34.0)
MCHC: 35.4 g/dL (ref 30.0–36.0)
MCV: 85.5 fL (ref 78.0–100.0)
Platelets: 203 10*3/uL (ref 150–400)
RBC: 4.69 MIL/uL (ref 4.22–5.81)
RDW: 12.4 % (ref 11.5–15.5)
WBC: 7.7 10*3/uL (ref 4.0–10.5)

## 2014-05-26 LAB — BASIC METABOLIC PANEL
Anion gap: 14 (ref 5–15)
BUN: 13 mg/dL (ref 6–23)
CHLORIDE: 99 meq/L (ref 96–112)
CO2: 25 mEq/L (ref 19–32)
Calcium: 9.2 mg/dL (ref 8.4–10.5)
Creatinine, Ser: 0.76 mg/dL (ref 0.50–1.35)
GFR calc Af Amer: >90 mL/min (ref >90)
GFR calc non Af Amer: >90 mL/min (ref >90)
Glucose, Bld: 95 mg/dL (ref 70–99)
Potassium: 3.5 mEq/L — ABNORMAL LOW (ref 3.7–5.3)
Sodium: 138 mEq/L (ref 137–147)

## 2014-05-26 LAB — I-STAT TROPONIN, ED: Troponin i, poc: 0 ng/mL (ref 0.00–0.08)

## 2014-05-26 MED ORDER — OXYCODONE-ACETAMINOPHEN 5-325 MG PO TABS
1.0000 | ORAL_TABLET | Freq: Once | ORAL | Status: AC
  Administered 2014-05-26: 1 via ORAL
  Filled 2014-05-26: qty 1

## 2014-05-26 MED ORDER — COLCHICINE 0.6 MG PO TABS: 0.6000 mg | ORAL_TABLET | Freq: Every day | ORAL | Status: DC

## 2014-05-26 NOTE — Discharge Instructions (Signed)
Pericarditis °Pericarditis is swelling (inflammation) of the pericardium. The pericardium is a thin, double-layered, fluid-filled tissue sac that surrounds the heart. The purpose of the pericardium is to contain the heart in the chest cavity and keep the heart from overexpanding. Different types of pericarditis can occur, such as: °· Acute pericarditis. Inflammation can develop suddenly in acute pericarditis. °· Chronic pericarditis. Inflammation develops gradually and is long-lasting in chronic pericarditis. °· Constrictive pericarditis. In this type of pericarditis, the layers of the pericardium stiffen and develop scar tissue. The scar tissue thickens and sticks together. This makes it difficult for the heart to pump and work as it normally does. °CAUSES  °Pericarditis can be caused from different conditions, such as: °· A bacterial, fungal or viral infection. °· After a heart attack (myocardial infarction). °· After open-heart surgery (coronary bypass graft surgery). °· Auto-immune conditions such as lupus, rheumatoid arthritis or scleroderma. °· Kidney failure. °· Low thyroid condition (hypothyroidism). °· Cancer from another part of the body that has spread (metastasized) to the pericardium. °· Chest injury or trauma. °· After radiation treatment. °· Certain medicines. °SYMPTOMS  °Symptoms of pericarditis can include: °· Chest pain. Chest pain symptoms may increase when laying down and may be relieved when sitting up and leaning forward. °· A chronic, dry cough. °· Heart palpitations. These may feel like rapid, fluttering or pounding heart beats. °· Chest pain may be worse when swallowing. °· Dizziness or fainting. °· Tiredness, fatigue or lethargy. °· Fever. °DIAGNOSIS  °Pericarditis is diagnosed by the following: °· A physical exam. A heart sound called a pericardial friction rub may be heard when your caregiver listens to your heart. °· Blood work. Blood may be drawn to check for an infection and to look at  your blood chemistry. °· Electrocardiography. During electrocardiography your heart's electrical activity is monitored and recorded with a tracing on paper (electrocardiogram [ECG]). °· Echocardiography. °· Computed tomography (CT). °· Magnetic resonance image (MRI). °TREATMENT  °To treat pericarditis, it is important to know the cause of it. The cause of pericarditis determines the treatment.  °· If the cause of pericarditis is due to an infection, treatment is based on the type of infection. If an infection is suspected in the pericardial fluid, a procedure called a pericardial fluid culture and biopsy may be done. This takes a sample of the pericardial fluid. The sample is sent to a lab which runs tests on the pericardial fluid to check for an infection. °· If the autoimmune disease is the cause, treatment of the autoimmune condition will help improve the pericarditis. °· If the cause of pericarditis is not known, anti-inflammatory medicines may be used to help decrease the inflammation. °· Surgery may be needed. The following are types of surgeries or procedures that may be done to treat pericarditis: °¨ Pericardial window. A pericardial window makes a cut (incision) into the pericardial sac. This allows excess fluid in the pericardium to drain. °¨ Pericardiocentesis. A pericardiocentesis is also known as a pericardial tap. This procedure uses a needle that is guided by X-ray to drain (aspirate) excess fluid from the pericardium. °¨ Pericardiectomy. A pericardiectomy removes part or all of the pericardium. °HOME CARE INSTRUCTIONS  °· Do not smoke. If you smoke, quit. Your caregiver can help you quit smoking. °· Maintain a healthy weight. °· Follow an exercise program as told by your caregiver. °· If you drink alcohol, do so in moderation. °· Eat a heart healthy diet. A registered dietician can help you learn about   healthy food choices. °· Keep a list of all your medicines with you at all times. Include the name,  dose, how often it is taken and how it is taken. °SEEK IMMEDIATE MEDICAL CARE IF:  °· You have chest pain or feelings of chest pressure. °· You have sweating (diaphoresis) when at rest. °· You have irregular heartbeats (palpitations). °· You have rapid, racing heart beats. °· You have unexplained fainting episodes. °· You feel sick to your stomach (nausea) or vomiting without cause. °· You have unexplained weakness. °If you develop any of the symptoms which originally made you seek care, call for local emergency medical help. Do not drive yourself to the hospital. °Document Released: 03/10/2001 Document Revised: 12/07/2011 Document Reviewed: 09/16/2011 °Exit[employer]® Patient Information ©2015 [employer]. This information is not intended to replace advice given to you by your health care provider. Make sure you discuss any questions you have with your health care provider. ° °

## 2014-05-26 NOTE — ED Provider Notes (Signed)
CSN: [id number]     Arrival date & time 05/25/14  2346 History   First MD Initiated Contact with Patient 05/26/14 0045     Chief Complaint  Patient presents with  . Chest Pain     (Consider location/radiation/quality/duration/timing/severity/associated sxs/prior Treatment) Patient is a 28 y.o. male presenting with chest pain. The history is provided by the patient.  Chest Pain Associated symptoms: no abdominal pain, no back pain, no headache, no nausea, no numbness, no shortness of breath, not vomiting and no weakness    patient presents with chest pain. On his right upper and mid chest. Worse with breathing and certain movements. States it feels like his previous episodes of pericarditis. He has had a pericardial window has had episodes of pericarditis since. He states this feels the same but usually ibuprofen makes it go away and it is not. No lightheadedness dizziness. No cough. Shortness of breath. He is an occasional smoker but states she's not smoke in the last month.  Past Medical History  Diagnosis Date  . Pericarditis    Past Surgical History  Procedure Laterality Date  . Cosmetic surgery    . Pericardial window  2011    for pericarditis  . Appendectomy     Family History  Problem Relation Age of Onset  . Hyperlipidemia Father   . Asthma Father   . Hyperlipidemia Paternal Grandfather   . Hypertension Paternal Grandfather   . Thyroid disease Mother   . Asthma Sister   . Colon cancer Maternal Grandmother    History  Substance Use Topics  . Smoking status: Never Smoker   . Smokeless tobacco: Not on file  . Alcohol Use: Yes    Review of Systems  Constitutional: Negative for activity change and appetite change.  Eyes: Negative for pain.  Respiratory: Negative for chest tightness and shortness of breath.   Cardiovascular: Positive for chest pain. Negative for leg swelling.  Gastrointestinal: Negative for nausea, vomiting, abdominal pain and diarrhea.   Genitourinary: Negative for flank pain.  Musculoskeletal: Negative for back pain and neck stiffness.  Skin: Negative for rash.  Neurological: Negative for weakness, numbness and headaches.  Psychiatric/Behavioral: Negative for behavioral problems.      Allergies  Review of patient's allergies indicates no known allergies.  Home Medications   Prior to Admission medications   Medication Sig Start Date End Date Taking? Authorizing Provider  acetaminophen (TYLENOL) 325 MG tablet Take 650 mg by mouth every 6 (six) hours as needed for mild pain.   Yes Historical Provider, MD  aspirin 325 MG tablet Take 325 mg by mouth every 6 (six) hours as needed for mild pain.   Yes Historical Provider, MD  ibuprofen (ADVIL,MOTRIN) 200 MG tablet Take 600 mg by mouth every 6 (six) hours as needed for mild pain.   Yes Historical Provider, MD   BP 96/71  Pulse 57  Temp(Src) 97.7 F (36.5 C) (Oral)  Resp 21  SpO2 97% Physical Exam  Nursing note and vitals reviewed. Constitutional: He is oriented to person, place, and time. He appears well-developed and well-nourished.  HENT:  Head: Normocephalic and atraumatic.  Eyes: EOM are normal. Pupils are equal, round, and reactive to light.  Neck: Normal range of motion. Neck supple.  Cardiovascular: Normal rate, regular rhythm and normal heart sounds.  Exam reveals no friction rub.   No murmur heard. Pulmonary/Chest: Effort normal and breath sounds normal. He exhibits tenderness.  Some tenderness to right parasternal area.  Abdominal: Soft. Bowel sounds are  normal. He exhibits no distension and no mass. There is no tenderness. There is no rebound and no guarding.  Musculoskeletal: Normal range of motion. He exhibits no edema.  Neurological: He is alert and oriented to person, place, and time. No cranial nerve deficit.  Skin: Skin is warm and dry.  Psychiatric: He has a normal mood and affect.    ED Course  Procedures (including critical care time) Labs  Review Labs Reviewed  BASIC METABOLIC PANEL - Abnormal; Notable for the following:    Potassium 3.5 (*)    All other components within normal limits  CBC  I-STAT TROPOININ, ED    Imaging Review No results found.   EKG Interpretation   Date/Time:  Friday May 25 2014 23:50:20 EDT Ventricular Rate:  72 PR Interval:  140 QRS Duration: 100 QT Interval:  396 QTC Calculation: 433 R Axis:   42 Text Interpretation:  Normal sinus rhythm Left atrial enlargement  Incomplete right bundle branch block Left ventricular hypertrophy Abnormal  ECG Confirmed by Rubin Payor  MD, Harrold Donath ([id number]) on 05/26/2014 12:50:41 AM      MDM   Final diagnoses:  None    Patient with chest pain. History of pericarditis. Felt like this. EKG and lab work reassuring. Start on colchicine and pain medicine and have followup with cardiology. No relief with ibuprofen.    Juliet Rude. Rubin Payor, MD 05/26/14 [phone]

## 2014-05-28 ENCOUNTER — Emergency Department (HOSPITAL_COMMUNITY)
Admission: EM | Admit: 2014-05-28 | Discharge: 2014-05-28 | Disposition: A | Discharge status: Home / Self Care | Payer: Managed Care, Other (non HMO) | Attending: Emergency Medicine | Admitting: Emergency Medicine

## 2014-05-28 ENCOUNTER — Emergency Department (HOSPITAL_COMMUNITY): Payer: Managed Care, Other (non HMO)

## 2014-05-28 ENCOUNTER — Encounter (HOSPITAL_COMMUNITY): Payer: Self-pay | Admitting: Emergency Medicine

## 2014-05-28 DIAGNOSIS — R079 Chest pain, unspecified: Secondary | ICD-10-CM | POA: Insufficient documentation

## 2014-05-28 DIAGNOSIS — Z7982 Long term (current) use of aspirin: Secondary | ICD-10-CM | POA: Diagnosis not present

## 2014-05-28 DIAGNOSIS — Z79899 Other long term (current) drug therapy: Secondary | ICD-10-CM | POA: Insufficient documentation

## 2014-05-28 DIAGNOSIS — R112 Nausea with vomiting, unspecified: Secondary | ICD-10-CM | POA: Diagnosis not present

## 2014-05-28 DIAGNOSIS — Z8679 Personal history of other diseases of the circulatory system: Secondary | ICD-10-CM | POA: Diagnosis not present

## 2014-05-28 LAB — CBC WITH DIFFERENTIAL/PLATELET
Basophils Absolute: 0 10*3/uL (ref 0.0–0.1)
Basophils Relative: 0 % (ref 0–1)
EOS PCT: 1 % (ref 0–5)
Eosinophils Absolute: 0.1 10*3/uL (ref 0.0–0.7)
HCT: 39.8 % (ref 39.0–52.0)
Hemoglobin: 13.7 g/dL (ref 13.0–17.0)
LYMPHS ABS: 1.4 10*3/uL (ref 0.7–4.0)
LYMPHS PCT: 16 % (ref 12–46)
MCH: 30.2 pg (ref 26.0–34.0)
MCHC: 34.4 g/dL (ref 30.0–36.0)
MCV: 87.9 fL (ref 78.0–100.0)
MONOS PCT: 10 % (ref 3–12)
Monocytes Absolute: 0.9 10*3/uL (ref 0.1–1.0)
Neutro Abs: 6.5 10*3/uL (ref 1.7–7.7)
Neutrophils Relative %: 73 % (ref 43–77)
Platelets: 205 10*3/uL (ref 150–400)
RBC: 4.53 MIL/uL (ref 4.22–5.81)
RDW: 12.4 % (ref 11.5–15.5)
WBC: 9 10*3/uL (ref 4.0–10.5)

## 2014-05-28 LAB — LIPASE, BLOOD: LIPASE: 13 U/L (ref 11–59)

## 2014-05-28 LAB — COMPREHENSIVE METABOLIC PANEL
ALT: 9 U/L (ref 0–53)
AST: 11 U/L (ref 0–37)
Albumin: 3.6 g/dL (ref 3.5–5.2)
Alkaline Phosphatase: 69 U/L (ref 39–117)
Anion gap: 12 (ref 5–15)
BUN: 10 mg/dL (ref 6–23)
CALCIUM: 8.9 mg/dL (ref 8.4–10.5)
CO2: 28 mEq/L (ref 19–32)
Chloride: 99 mEq/L (ref 96–112)
Creatinine, Ser: 0.66 mg/dL (ref 0.50–1.35)
GLUCOSE: 94 mg/dL (ref 70–99)
Potassium: 3.5 mEq/L — ABNORMAL LOW (ref 3.7–5.3)
Sodium: 139 mEq/L (ref 137–147)
Total Bilirubin: 0.3 mg/dL (ref 0.3–1.2)
Total Protein: 7 g/dL (ref 6.0–8.3)

## 2014-05-28 LAB — I-STAT TROPONIN, ED: Troponin i, poc: 0 ng/mL (ref 0.00–0.08)

## 2014-05-28 MED ORDER — ONDANSETRON HCL 4 MG/2ML IJ SOLN
4.0000 mg | Freq: Once | INTRAMUSCULAR | Status: AC
  Administered 2014-05-28: 4 mg via INTRAVENOUS
  Filled 2014-05-28: qty 2

## 2014-05-28 MED ORDER — KETOROLAC TROMETHAMINE 30 MG/ML IJ SOLN
30.0000 mg | Freq: Once | INTRAMUSCULAR | Status: AC
  Administered 2014-05-28: 30 mg via INTRAVENOUS
  Filled 2014-05-28: qty 1

## 2014-05-28 NOTE — ED Notes (Signed)
Patient states started having chest pain last Tuesday.  Patient states constant with no relief and radiating to R scapula.   Patient states N/V with last vomiting at 0430 this a.m.  Patient states he was seen for same last week.

## 2014-05-28 NOTE — ED Notes (Signed)
MD at bedside. 

## 2014-05-28 NOTE — ED Provider Notes (Signed)
CSN: [id number]     Arrival date & time 05/28/14  1132 History   First MD Initiated Contact with Patient 05/28/14 1144     Chief Complaint  Patient presents with  . Chest Pain     (Consider location/radiation/quality/duration/timing/severity/associated sxs/prior Treatment) HPI Comments: Patient presents today with a chief complaint of substernal chest pain.  He reports that the pain has been present for the past 6 days and is constant and gradually worsening.  He reports that the pain radiates through to his back.  He has taken Aleve, Colchicine, and Percocet for the pain without relief.  He does have a history of Pericarditis and reports that the pain feels similar.  Pain worse with raising his right arm, turning his head, lying flat, and coughing  He reports mild associated cough.  Denies SOB, fever, chills, abdominal pain., dizziness, or lightheadedness.  Pain associated with nausea and one episode of vomiting a small amount today.  He was seen in the ED three days ago for the same.  He reports that he has an appointment scheduled with [facility] tomorrow.  He denies prolonged travel or surgeries in the past 4 weeks.  Denies LE edema.  Denies history of DVT or PE.  Denies prior history of cardiac disease aside from the Pericarditis.  Denies any family history of Cardiac Disease or history of sudden death of unknown cause at a young age.  He denies history of HTN, DM, or Hyperlipidemia.    Patient is a 28 y.o. male presenting with chest pain. The history is provided by the patient.  Chest Pain Associated symptoms: nausea and vomiting   Associated symptoms: no fever     Past Medical History  Diagnosis Date  . Pericarditis    Past Surgical History  Procedure Laterality Date  . Cosmetic surgery    . Pericardial window  2011    for pericarditis  . Appendectomy    . Tonsillectomy     Family History  Problem Relation Age of Onset  . Hyperlipidemia Father   . Asthma Father   .  Hyperlipidemia Paternal Grandfather   . Hypertension Paternal Grandfather   . Thyroid disease Mother   . Asthma Sister   . Colon cancer Maternal Grandmother    History  Substance Use Topics  . Smoking status: Never Smoker   . Smokeless tobacco: Not on file  . Alcohol Use: Yes    Review of Systems  Constitutional: Negative for fever and chills.  Cardiovascular: Positive for chest pain.  Gastrointestinal: Positive for nausea and vomiting.  All other systems reviewed and are negative.     Allergies  Review of patient's allergies indicates no known allergies.  Home Medications   Prior to Admission medications   Medication Sig Start Date End Date Taking? Authorizing Provider  acetaminophen (TYLENOL) 325 MG tablet Take 650 mg by mouth every 6 (six) hours as needed for mild pain.    Historical Provider, MD  aspirin 325 MG tablet Take 325 mg by mouth every 6 (six) hours as needed for mild pain.    Historical Provider, MD  colchicine (COLCRYS) 0.6 MG tablet Take 1 tablet (0.6 mg total) by mouth daily. 05/26/14   Juliet Rude. Pickering, MD  ibuprofen (ADVIL,MOTRIN) 200 MG tablet Take 600 mg by mouth every 6 (six) hours as needed for mild pain.    Historical Provider, MD   BP 129/79  Pulse 80  Temp(Src) 97.6 F (36.4 C) (Oral)  Resp 18  Ht 5'  11" (1.803 m)  Wt 135 lb (61.236 kg)  BMI 18.84 kg/m2  SpO2 99% Physical Exam  Nursing note and vitals reviewed. Constitutional: He appears well-developed and well-nourished.  HENT:  Head: Normocephalic and atraumatic.  Mouth/Throat: Oropharynx is clear and moist.  Neck: Normal range of motion. Neck supple.  Cardiovascular: Normal rate, regular rhythm and normal heart sounds.   Pulmonary/Chest: Effort normal and breath sounds normal. No respiratory distress. He has no wheezes. He has no rales. He exhibits tenderness.  Abdominal: Soft. There is no tenderness.  Musculoskeletal: Normal range of motion.  No LE edema bilaterally  Neurological:  He is alert.  Skin: Skin is warm and dry.  Psychiatric: He has a normal mood and affect.    ED Course  Procedures (including critical care time) Labs Review Labs Reviewed  CBC WITH DIFFERENTIAL  COMPREHENSIVE METABOLIC PANEL  LIPASE, BLOOD  I-STAT TROPOININ, ED    Imaging Review Dg Chest 2 View  05/28/2014   CLINICAL DATA:  Right upper chest pain with shortness of breath  EXAM: CHEST  2 VIEW  COMPARISON:  PA and lateral chest of May 26, 2014  FINDINGS: The lungs are hyperinflated. New blunting of the right lateral costophrenic angle is present. There is pleural thickening versus fluid in the right pulmonary apex. There is no pneumothorax. There is no discrete alveolar infiltrate. The heart is top-normal in size. The pulmonary vascularity is not engorged. The interstitial markings are slightly more conspicuous today. There is chronic bony deformity consistent with a pectus excavatum contour.  IMPRESSION: COPD with a new small right pleural effusion and bilateral interstitial edema of cardiac or noncardiac cause.   Electronically Signed   By: David  [place]   On: 05/28/2014 13:00     EKG Interpretation None     2:53 PM Patient discussed with Dr. Rhunette Croft.  Patient ambulated in the ED without difficulty. MDM   Final diagnoses:  None   Patient is a 28 year old male presenting with chest pain that has been constant for the past 6 days.  EKG with nonspecific changes.  Troponin negative.  Labs unremarkable.  CXR showing bilateral interstitial edema and small right pleural effusion.  Patient is also PERC negative.  Patient has an appointment scheduled with [facility] tomorrow.  Feel that the patient is stable for discharge.  Patient also discussed with Dr Rhunette Croft who agreed with the plan.  Return precautions given to the patient.      Santiago Glad, PA-C 05/29/14 2238

## 2014-05-30 ENCOUNTER — Emergency Department (HOSPITAL_COMMUNITY): Payer: BC Managed Care – PPO

## 2014-05-30 ENCOUNTER — Encounter (HOSPITAL_COMMUNITY): Payer: Self-pay | Admitting: Emergency Medicine

## 2014-05-30 ENCOUNTER — Emergency Department (HOSPITAL_COMMUNITY)
Admission: EM | Admit: 2014-05-30 | Discharge: 2014-05-30 | Disposition: A | Discharge status: Home / Self Care | Payer: BC Managed Care – PPO | Attending: Emergency Medicine | Admitting: Emergency Medicine

## 2014-05-30 DIAGNOSIS — Z8679 Personal history of other diseases of the circulatory system: Secondary | ICD-10-CM | POA: Diagnosis not present

## 2014-05-30 DIAGNOSIS — Z79899 Other long term (current) drug therapy: Secondary | ICD-10-CM | POA: Insufficient documentation

## 2014-05-30 DIAGNOSIS — Z7982 Long term (current) use of aspirin: Secondary | ICD-10-CM | POA: Insufficient documentation

## 2014-05-30 DIAGNOSIS — R091 Pleurisy: Secondary | ICD-10-CM | POA: Diagnosis not present

## 2014-05-30 DIAGNOSIS — M549 Dorsalgia, unspecified: Secondary | ICD-10-CM | POA: Diagnosis not present

## 2014-05-30 DIAGNOSIS — R0602 Shortness of breath: Secondary | ICD-10-CM | POA: Insufficient documentation

## 2014-05-30 DIAGNOSIS — R079 Chest pain, unspecified: Secondary | ICD-10-CM | POA: Insufficient documentation

## 2014-05-30 LAB — BASIC METABOLIC PANEL
ANION GAP: 12 (ref 5–15)
BUN: 12 mg/dL (ref 6–23)
CHLORIDE: 99 meq/L (ref 96–112)
CO2: 30 mEq/L (ref 19–32)
CREATININE: 0.71 mg/dL (ref 0.50–1.35)
Calcium: 8.9 mg/dL (ref 8.4–10.5)
Glucose, Bld: 90 mg/dL (ref 70–99)
Potassium: 4.1 mEq/L (ref 3.7–5.3)
Sodium: 141 mEq/L (ref 137–147)

## 2014-05-30 LAB — CBC WITH DIFFERENTIAL/PLATELET
BASOS ABS: 0 10*3/uL (ref 0.0–0.1)
Basophils Relative: 0 % (ref 0–1)
EOS PCT: 2 % (ref 0–5)
Eosinophils Absolute: 0.1 10*3/uL (ref 0.0–0.7)
HEMATOCRIT: 38.9 % — AB (ref 39.0–52.0)
HEMOGLOBIN: 13.4 g/dL (ref 13.0–17.0)
LYMPHS ABS: 1.2 10*3/uL (ref 0.7–4.0)
LYMPHS PCT: 19 % (ref 12–46)
MCH: 30.4 pg (ref 26.0–34.0)
MCHC: 34.4 g/dL (ref 30.0–36.0)
MCV: 88.2 fL (ref 78.0–100.0)
MONO ABS: 0.8 10*3/uL (ref 0.1–1.0)
MONOS PCT: 12 % (ref 3–12)
Neutro Abs: 4.3 10*3/uL (ref 1.7–7.7)
Neutrophils Relative %: 67 % (ref 43–77)
Platelets: 228 10*3/uL (ref 150–400)
RBC: 4.41 MIL/uL (ref 4.22–5.81)
RDW: 12.4 % (ref 11.5–15.5)
WBC: 6.5 10*3/uL (ref 4.0–10.5)

## 2014-05-30 LAB — URINALYSIS, ROUTINE W REFLEX MICROSCOPIC
Bilirubin Urine: NEGATIVE
Glucose, UA: NEGATIVE mg/dL
Hgb urine dipstick: NEGATIVE
KETONES UR: 15 mg/dL — AB
LEUKOCYTES UA: NEGATIVE
NITRITE: NEGATIVE
PH: 5.5 (ref 5.0–8.0)
Protein, ur: NEGATIVE mg/dL
Specific Gravity, Urine: 1.023 (ref 1.005–1.030)
UROBILINOGEN UA: 1 mg/dL (ref 0.0–1.0)

## 2014-05-30 LAB — RAPID URINE DRUG SCREEN, HOSP PERFORMED
Amphetamines: NOT DETECTED
BARBITURATES: NOT DETECTED
BENZODIAZEPINES: NOT DETECTED
Cocaine: NOT DETECTED
Opiates: POSITIVE — AB
Tetrahydrocannabinol: NOT DETECTED

## 2014-05-30 LAB — I-STAT TROPONIN, ED: Troponin i, poc: 0.01 ng/mL (ref 0.00–0.08)

## 2014-05-30 MED ORDER — HYDROCODONE-ACETAMINOPHEN 5-325 MG PO TABS: 1.0000 | ORAL_TABLET | Freq: Four times a day (QID) | ORAL | Status: DC | PRN

## 2014-05-30 MED ORDER — MORPHINE SULFATE 4 MG/ML IJ SOLN
4.0000 mg | Freq: Once | INTRAMUSCULAR | Status: AC
  Administered 2014-05-30: 4 mg via INTRAVENOUS
  Filled 2014-05-30: qty 1

## 2014-05-30 MED ORDER — PREDNISONE 20 MG PO TABS: ORAL_TABLET | ORAL | Status: DC

## 2014-05-30 MED ORDER — PREDNISONE 20 MG PO TABS
60.0000 mg | ORAL_TABLET | Freq: Once | ORAL | Status: AC
  Administered 2014-05-30: 60 mg via ORAL
  Filled 2014-05-30: qty 3

## 2014-05-30 NOTE — Discharge Instructions (Signed)

## 2014-05-30 NOTE — ED Notes (Signed)
MD at bedside. 

## 2014-05-30 NOTE — ED Provider Notes (Signed)
CSN: [id number]     Arrival date & time 05/30/14  4098 History   First MD Initiated Contact with Patient 05/30/14 [phone]     Chief Complaint  Patient presents with  . Flank Pain  . Shortness of Breath     (Consider location/radiation/quality/duration/timing/severity/associated sxs/prior Treatment) Patient is a 28 y.o. male presenting with shortness of breath and back pain. The history is provided by the patient. No language interpreter was used.  Shortness of Breath Associated symptoms: chest pain   Associated symptoms: no abdominal pain, no cough, no fever, no headaches, no rash and no vomiting   Back Pain Pain location: R posterior chest. Quality: sharp. Radiates to:  Does not radiate Pain severity:  Severe Onset quality:  Sudden Duration:  2 hours Timing:  Constant Progression:  Unchanged Chronicity:  New Relieved by:  Nothing Worsened by:  Deep breathing Ineffective treatments: colchicine. Associated symptoms: chest pain   Associated symptoms: no abdominal pain, no abdominal swelling, no dysuria, no fever, no headaches, no leg pain, no numbness, no weakness and no weight loss   Chest pain:    Quality:  Dull   Severity:  Moderate   Duration:  1 week   Timing:  Intermittent   Progression:  Waxing and waning   Chronicity:  Recurrent Risk factors comment:  Hx of pericarditis   Past Medical History  Diagnosis Date  . Pericarditis    Past Surgical History  Procedure Laterality Date  . Cosmetic surgery    . Pericardial window  2011    for pericarditis  . Appendectomy    . Tonsillectomy     Family History  Problem Relation Age of Onset  . Hyperlipidemia Father   . Asthma Father   . Hyperlipidemia Paternal Grandfather   . Hypertension Paternal Grandfather   . Thyroid disease Mother   . Asthma Sister   . Colon cancer Maternal Grandmother    History  Substance Use Topics  . Smoking status: Never Smoker   . Smokeless tobacco: Not on file  . Alcohol Use: Yes     Review of Systems  Constitutional: Negative for fever, weight loss, activity change, appetite change and fatigue.  HENT: Negative for congestion, facial swelling, rhinorrhea and trouble swallowing.   Eyes: Negative for photophobia and pain.  Respiratory: Positive for shortness of breath. Negative for cough and chest tightness.   Cardiovascular: Positive for chest pain. Negative for leg swelling.  Gastrointestinal: Negative for nausea, vomiting, abdominal pain, diarrhea and constipation.  Endocrine: Negative for polydipsia and polyuria.  Genitourinary: Negative for dysuria, urgency, decreased urine volume and difficulty urinating.  Musculoskeletal: Positive for back pain. Negative for gait problem.  Skin: Negative for color change, rash and wound.  Allergic/Immunologic: Negative for immunocompromised state.  Neurological: Negative for dizziness, facial asymmetry, speech difficulty, weakness, numbness and headaches.  Psychiatric/Behavioral: Negative for confusion, decreased concentration and agitation.      Allergies  Review of patient's allergies indicates no known allergies.  Home Medications   Prior to Admission medications   Medication Sig Start Date End Date Taking? Authorizing Provider  acetaminophen (TYLENOL) 325 MG tablet Take 650 mg by mouth every 6 (six) hours as needed for mild pain.   Yes Historical Provider, MD  aspirin 325 MG tablet Take 325 mg by mouth every 6 (six) hours as needed for mild pain.   Yes Historical Provider, MD  colchicine 0.6 MG tablet Take 0.6 mg by mouth 2 (two) times daily.   Yes Historical Provider, MD  HYDROCODONE-ACETAMINOPHEN PO Take 1 tablet by mouth every 6 (six) hours as needed (for pain).   Yes Historical Provider, MD  ibuprofen (ADVIL,MOTRIN) 200 MG tablet Take 600 mg by mouth every 6 (six) hours as needed for mild pain.   Yes Historical Provider, MD  naproxen sodium (ANAPROX) 220 MG tablet Take 220 mg by mouth 2 (two) times daily as needed  (for pain).   Yes Historical Provider, MD  Ondansetron (ZOFRAN ODT PO) Take 1 tablet by mouth every 8 (eight) hours as needed (for nausea / vomiting).   Yes Historical Provider, MD  HYDROcodone-acetaminophen (NORCO) 5-325 MG per tablet Take 1 tablet by mouth every 6 (six) hours as needed. 05/30/14   Toy Cookey, MD  predniSONE (DELTASONE) 20 MG tablet 3 tabs po daily x 3 days, then 2 tabs x 3 days, then 1.5 tabs x 3 days, then 1 tab x 3 days, then 0.5 tabs x 3 days 05/30/14   Toy Cookey, MD   BP 123/68  Pulse 66  Temp(Src) 97.6 F (36.4 C) (Oral)  Resp 24  SpO2 99% Physical Exam  Constitutional: He is oriented to person, place, and time. He appears well-developed and well-nourished. No distress.  HENT:  Head: Normocephalic and atraumatic.  Mouth/Throat: No oropharyngeal exudate.  Eyes: Pupils are equal, round, and reactive to light.  Neck: Normal range of motion. Neck supple.  Cardiovascular: Normal rate, regular rhythm and normal heart sounds.  Exam reveals no gallop and no friction rub.   No murmur heard. Pulmonary/Chest: Effort normal and breath sounds normal. No respiratory distress. He has no wheezes. He has no rales.      Abdominal: Soft. Bowel sounds are normal. He exhibits no distension and no mass. There is no tenderness. There is no rebound and no guarding.  Musculoskeletal: Normal range of motion. He exhibits no edema and no tenderness.  Neurological: He is alert and oriented to person, place, and time.  Skin: Skin is warm and dry.  Psychiatric: He has a normal mood and affect.    ED Course  Procedures (including critical care time) Labs Review Labs Reviewed  URINE RAPID DRUG SCREEN (HOSP PERFORMED) - Abnormal; Notable for the following:    Opiates POSITIVE (*)    All other components within normal limits  URINALYSIS, ROUTINE W REFLEX MICROSCOPIC - Abnormal; Notable for the following:    Ketones, ur 15 (*)    All other components within normal limits  CBC WITH  DIFFERENTIAL - Abnormal; Notable for the following:    HCT 38.9 (*)    All other components within normal limits  BASIC METABOLIC PANEL  CBC WITH DIFFERENTIAL  Rosezena Sensor, ED    Imaging Review Dg Chest 2 View  05/30/2014   CLINICAL DATA:  Chest pain.  EXAM: CHEST  2 VIEW  COMPARISON:  PA and lateral chest of May 28, 2014.  FINDINGS: The lungs remain hyperinflated. The coarse infrahilar lung markings have improved. The small right pleural effusion is stable. The heart and pulmonary vascularity are normal. The bony thorax is exhibits no acute abnormalities.  IMPRESSION: COPD. There has been interval improvement in the appearance of the pulmonary interstitial markings especially inferiorly which may reflect resolving interstitial edema. A small right pleural effusion persists.   Electronically Signed   By: David  [place]   On: 05/30/2014 08:13   Dg Chest 2 View  05/28/2014   CLINICAL DATA:  Right upper chest pain with shortness of breath  EXAM: CHEST  2 VIEW  COMPARISON:  PA and lateral chest of May 26, 2014  FINDINGS: The lungs are hyperinflated. New blunting of the right lateral costophrenic angle is present. There is pleural thickening versus fluid in the right pulmonary apex. There is no pneumothorax. There is no discrete alveolar infiltrate. The heart is top-normal in size. The pulmonary vascularity is not engorged. The interstitial markings are slightly more conspicuous today. There is chronic bony deformity consistent with a pectus excavatum contour.  IMPRESSION: COPD with a new small right pleural effusion and bilateral interstitial edema of cardiac or noncardiac cause.   Electronically Signed   By: David  [place]   On: 05/28/2014 13:00     EKG Interpretation   Date/Time:  Wednesday May 30 2014 07:24:47 EDT Ventricular Rate:  66 PR Interval:  142 QRS Duration: 103 QT Interval:  410 QTC Calculation: 430 R Axis:   6 Text Interpretation:  Sinus rhythm Left atrial  enlargement Left  ventricular hypertrophy Incomplete right bundle branch block Nonspecific  ST and T wave abnormality No significant change since last tracing on  8/31/'15 Confirmed by Peretz Thieme  MD, Ludell Zacarias [phone]) on 05/30/2014 7:37:42 AM     FROM [facility]:  CT chest without contrast - Final result (05/29/2014 1:34 PM EDT)  CT chest without contrast - Final result (05/29/2014 1:34 PM EDT)  Narrative  EXAM: CT Chest without contrast  INDICATION: Acute chest pain, evaluate for pleural effusion and pulmonary  parenchymal disease.  TECHNIQUE: Serial axial images from apices to posterior sulci. Both lung  and soft tissue images were obtained.  CONTRAST DOSE: None.  COMPARISON: May 24, 2002.  FINDINGS:  Chest wall: No soft tissue masses or other abnormalities. No axillary  adenopathy seen.  Mediastinum: No masses or adenopathy. Vascular structures appear  unremarkable.  Hila: No masses or adenopathy.  Pulmonary Arteries: No enlargement or evidence of thrombus.  Trachea and bronchi: No narrowing or masses observed.  Pulmonary parenchyma: There is some pulmonary scarring seen in the medial  segment of the right middle lobe.  Pleura: Small to moderate right pleural effusion with minimal adjacent  passive atelectasis.  Upper abdomen: No abnormalities of the visualized upper abdominal organs  are noted. There is no evidence of adrenal or liver metastases.  IMPRESSION: Small to moderate right pleural effusion with minimal adjacent  to the atelectasis. There is some pulmonary scarring seen in the medial  segment of the right middle lobe. No focal consolidation or pneumonia seen.  Electronically Signed by: Marjo Bicker, MD  Electronically Signed on: 05/29/2014 3:36 PM    Gerrianne Scale, MD - 05/29/2014 8:56 PM EDT  Quick Note:   His CT shows a small right pleural effusion, no pneumonia. I think he has right-sided pleuritis, not pericarditis. A short burst of prednisone may still help, so OK  to give him a  prednisone dose pack. Still waiting on some of the autoimmune labs to come back. Ultimately, though, this doesn't seem like a cardiac problem, so f/u will prob. Be with either pulmonary or rheumatology.       MDM   Final diagnoses:  Pleuritis    Pt is a 28 y.o. male with Pmhx as above who presents with report of sudden onset, sharp, R sided posterior chest pain, worse w/ deep breahting and assoc SOB since 6am this morning. Pt seen for CP on 8/28, and 8/31 with the second visit describing the pain radiating to the back. He saw his cardiologist through [facility] yesterday, had CT chest & echo,  with lab work but has not received results. Denies fever, chills, cough, leg pain/swelling, n/v, d/a, urinary symptoms. Cardiopulm exam benign. EKG unchanged from 8/31. He is PERC negative (no further w/u for PE), and also low risk group using [facility]. I do not think this is flank pain or abdominal pain based on location of pain and lac of assoc symptoms.   CT chest w/o contrast from [facility] shows small to moderate R pleural effusion. The note from Dr. Christell Constant who saw him yesterday after CT resulted " I think he has right-sided pleuritis, not pericarditis.Marland KitchenMarland KitchenMarland KitchenUltimately, though, this doesn't seem like a cardiac problem, so f/u will prob. Be with either pulmonary or rheumatology."   CXR here with small effusion, trop nml, UA w/o blood or infection, CBC, BMP, grossly unremarkable. Pt feeling somewhat better after IV morphine. Will give second dose, as well as 60 mg PO pred, and speak w/ pulm about outpt f/u. Agree w/ Dr. Christell Constant that this is likely pleuritis. Will start on pred per Cardiology recs, norco also given. Pt scheduled for pulm appt on 9/11.  Return precautions given for new or worsening symptoms including worsening pain, worsening SOB, fever, leg swelling.          Toy Cookey, MD 05/30/14 [phone]

## 2014-05-30 NOTE — ED Notes (Addendum)
Pt from home with c/o right sided flank pain starting this am.  Pt denies N/V/D, fever, or urinary symptoms, no hx of kidney stones.  Pt also reports shortness of breath starting a week ago which he saw a cardiologist for yesterday, hx of pericarditis.  Pt in NAD, A&O.

## 2014-05-31 NOTE — ED Provider Notes (Signed)
Medical screening examination/treatment/procedure(s) were conducted as a shared visit with non-physician practitioner(s) and myself.  I personally evaluated the patient during the encounter.   EKG Interpretation   Date/Time:  Monday May 28 2014 11:36:43 EDT Ventricular Rate:  64 PR Interval:  148 QRS Duration: 102 QT Interval:  388 QTC Calculation: 400 R Axis:   12 Text Interpretation:  Normal sinus rhythm Left atrial enlargement  Incomplete right bundle branch block Left ventricular hypertrophy  Nonspecific ST and T wave abnormality Abnormal ECG ED PHYSICIAN  INTERPRETATION AVAILABLE IN [facility] Confirmed by TEST, Record  ([id number]) on 05/30/2014 6:54:02 AM      Pt with chest pain. EKG shows some changes that are inconsistent with a young individual. NO ACS concerns, and given a Cards appt tomorrow, and the indicidual showing good judgement and reliability, we shall discharge. Pt being tx for pericarditis. Asked him to continue ASA.  Derwood Kaplan, MD 05/31/14 [phone]

## 2014-06-01 ENCOUNTER — Encounter: Payer: Managed Care, Other (non HMO) | Admitting: Cardiovascular Disease

## 2014-06-08 ENCOUNTER — Institutional Professional Consult (permissible substitution): Payer: BC Managed Care – PPO | Admitting: Emergency Medicine

## 2014-06-15 ENCOUNTER — Ambulatory Visit (INDEPENDENT_AMBULATORY_CARE_PROVIDER_SITE_OTHER): Payer: BC Managed Care – PPO | Admitting: Emergency Medicine

## 2014-06-15 ENCOUNTER — Encounter: Payer: Self-pay | Admitting: Emergency Medicine

## 2014-06-15 VITALS — BP 108/70 | HR 75 | Ht 71.0 in | Wt 137.0 lb

## 2014-06-15 DIAGNOSIS — R091 Pleurisy: Secondary | ICD-10-CM

## 2014-06-15 NOTE — Assessment & Plan Note (Signed)
Etiology is unclear. There are no easily identifiable triggers. Interestingly he has had prior episodes of both pleurisy and pericarditis. He underwent pericardial window in 2010. Autoimmune labs at that time were negative. He is currently asymptomatic and I discussed repeating the autoimmune panel with him. We will do this in approximately one month in followup by phone.

## 2014-06-15 NOTE — Progress Notes (Signed)
Subjective:    Patient ID: [name], male    DOB: 06-05-1986, 28 y.o.   MRN: [id number]  HPI 28 yo man, former smoker, hx pectus excavatum and repair 10/'02, hx pericarditis, referred today for eval of recent episode or chest pain, pleuritic in nature. Persisted until he had a pred taper. Now better. Interestingly he has had this happen before. No identifiable triggers.    Review of Systems  Constitutional: Negative for fever and unexpected weight change.  HENT: Positive for congestion, rhinorrhea and sinus pressure. Negative for dental problem, ear pain, nosebleeds, postnasal drip, sneezing, sore throat and trouble swallowing.   Eyes: Negative for redness and itching.  Respiratory: Negative for cough, chest tightness, shortness of breath and wheezing.   Cardiovascular: Negative for palpitations and leg swelling.  Gastrointestinal: Negative for nausea and vomiting.  Genitourinary: Negative for dysuria.  Musculoskeletal: Negative for joint swelling.  Skin: Negative for rash.  Neurological: Negative for headaches.  Hematological: Does not bruise/bleed easily.  Psychiatric/Behavioral: Negative for dysphoric mood. The patient is not nervous/anxious.    Past Medical History  Diagnosis Date  . Pericarditis      Family History  Problem Relation Age of Onset  . Hyperlipidemia Father   . Asthma Father   . Hyperlipidemia Paternal Grandfather   . Hypertension Paternal Grandfather   . Thyroid disease Mother   . Asthma Sister   . Colon cancer Maternal Grandmother      History   Social History  . Marital Status: Single    Spouse Name: N/A    Number of Children: N/A  . Years of Education: N/A   Occupational History  . bartender    Social History Main Topics  . Smoking status: Former Smoker -- 0.10 packs/day for 9 years    Types: Cigarettes    Quit date: 03/28/2014  . Smokeless tobacco: Former Neurosurgeon    Types: Snuff  . Alcohol Use: Yes     Comment: occassional  . Drug  Use: No  . Sexual Activity: Yes    Birth Control/ Protection: Condom   Other Topics Concern  . Not on file   Social History Narrative  . No narrative on file     No Known Allergies   Outpatient Prescriptions Prior to Visit  Medication Sig Dispense Refill  . acetaminophen (TYLENOL) 325 MG tablet Take 650 mg by mouth every 6 (six) hours as needed for mild pain.      Marland Kitchen aspirin 325 MG tablet Take 325 mg by mouth every 6 (six) hours as needed for mild pain.      Marland Kitchen HYDROcodone-acetaminophen (NORCO) 5-325 MG per tablet Take 1 tablet by mouth every 6 (six) hours as needed.  15 tablet  0  . ibuprofen (ADVIL,MOTRIN) 200 MG tablet Take 600 mg by mouth every 6 (six) hours as needed for mild pain.      . Ondansetron (ZOFRAN ODT PO) Take 1 tablet by mouth every 8 (eight) hours as needed (for nausea / vomiting).      . colchicine 0.6 MG tablet Take 0.6 mg by mouth 2 (two) times daily.      Marland Kitchen HYDROCODONE-ACETAMINOPHEN PO Take 1 tablet by mouth every 6 (six) hours as needed (for pain).      . naproxen sodium (ANAPROX) 220 MG tablet Take 220 mg by mouth 2 (two) times daily as needed (for pain).      . predniSONE (DELTASONE) 20 MG tablet 3 tabs po daily x 3 days, then  2 tabs x 3 days, then 1.5 tabs x 3 days, then 1 tab x 3 days, then 0.5 tabs x 3 days  27 tablet  0   No facility-administered medications prior to visit.         Objective:   Physical Exam Filed Vitals:   06/15/14 1415  BP: 108/70  Pulse: 75  Height:  (1.803 m)  Weight: 137 lb (62.143 kg)  SpO2: 97%   Gen: Pleasant, well-nourished, in no distress,  normal affect  ENT: No lesions,  mouth clear,  oropharynx clear, no postnasal drip  Neck: No JVD, no TMG, no carotid bruits  Lungs: No use of accessory muscles, no dullness to percussion, clear without rales or rhonchi  Cardiovascular: RRR, heart sounds normal, no murmur or gallops, no peripheral edema  Musculoskeletal: No deformities, no cyanosis or clubbing  Neuro:  alert, non focal  Skin: Warm, no lesions or rashes      Assessment & Plan:  Pleurisy Etiology is unclear. There are no easily identifiable triggers. Interestingly he has had prior episodes of both pleurisy and pericarditis. He underwent pericardial window in 2010. Autoimmune labs at that time were negative. He is currently asymptomatic and I discussed repeating the autoimmune panel with him. We will do this in approximately one month in followup by phone.

## 2014-06-15 NOTE — Patient Instructions (Signed)
We will perform screening autoimmune labs in approximately a month (mid October).  Dr Delton Coombes will call you with the results. If you do not hear from [place] after the labs are done, please call to inquire.  If there are any positive results then we will follow up in the office to address them

## 2014-10-05 ENCOUNTER — Ambulatory Visit (INDEPENDENT_AMBULATORY_CARE_PROVIDER_SITE_OTHER): Payer: BLUE CROSS/BLUE SHIELD | Admitting: Family Medicine

## 2014-10-05 VITALS — BP 110/64 | HR 60 | Temp 97.7°F | Resp 16 | Ht 69.25 in | Wt 146.0 lb

## 2014-10-05 DIAGNOSIS — R05 Cough: Secondary | ICD-10-CM

## 2014-10-05 DIAGNOSIS — R059 Cough, unspecified: Secondary | ICD-10-CM

## 2014-10-05 DIAGNOSIS — R058 Other specified cough: Secondary | ICD-10-CM

## 2014-10-05 MED ORDER — AZITHROMYCIN 250 MG PO TABS: ORAL_TABLET | ORAL | Status: DC

## 2014-10-05 MED ORDER — HYDROCODONE-HOMATROPINE 5-1.5 MG/5ML PO SYRP: 5.0000 mL | ORAL_SOLUTION | ORAL | Status: DC | PRN

## 2014-10-05 MED ORDER — BENZONATATE 100 MG PO CAPS: 100.0000 mg | ORAL_CAPSULE | Freq: Three times a day (TID) | ORAL | Status: DC | PRN

## 2014-10-05 NOTE — Patient Instructions (Signed)
Drink plenty of fluids and get enough rest  Take the Tessalon (benzonatate) cough tablets 1 or 2 pills 3 times daily as needed for cough. These are fairly nonsedating.  Take the hydrocodone cough syrup 1 teaspoon every 4-6 hours at bedtime or when you are not working and drowsiness will not be a problem. This is sedating.  Take the azithromycin 2 pills initially, then 1 daily for 4 days  Return if worsening symptoms

## 2014-10-05 NOTE — Progress Notes (Signed)
Subjective: 29 year old man who has had a respiratory symptoms since about Christmas. He now has a persistent cough, bothersome when trying to rest, productive of a lot of purulent mucus in the mornings. 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 He lives with 2 girls, neither of which were initially sent by one of them now has a cough also. He does not smoke.  Objective: His TMs are normal. Throat clear. Neck supple without significant nodes. Chest is clear to auscultation. Heart regular without murmur.  Assessment: Postviral cough syndrome  Plan: Azithromycin Hycodan Tessalon Fluids and rest Return if worse. No labs were done today but if he got worse might need labs and/or chest x-ray.

## 2015-07-13 ENCOUNTER — Ambulatory Visit (INDEPENDENT_AMBULATORY_CARE_PROVIDER_SITE_OTHER): Payer: BLUE CROSS/BLUE SHIELD

## 2015-07-13 ENCOUNTER — Ambulatory Visit (INDEPENDENT_AMBULATORY_CARE_PROVIDER_SITE_OTHER): Payer: BLUE CROSS/BLUE SHIELD | Admitting: Family Medicine

## 2015-07-13 VITALS — BP 130/60 | HR 85 | Temp 97.6°F | Resp 18 | Ht 69.25 in | Wt 148.2 lb

## 2015-07-13 DIAGNOSIS — R9389 Abnormal findings on diagnostic imaging of other specified body structures: Secondary | ICD-10-CM

## 2015-07-13 DIAGNOSIS — R071 Chest pain on breathing: Secondary | ICD-10-CM

## 2015-07-13 DIAGNOSIS — R938 Abnormal findings on diagnostic imaging of other specified body structures: Secondary | ICD-10-CM | POA: Diagnosis not present

## 2015-07-13 DIAGNOSIS — R0789 Other chest pain: Secondary | ICD-10-CM

## 2015-07-13 MED ORDER — HYDROCODONE-ACETAMINOPHEN 5-325 MG PO TABS: 1.0000 | ORAL_TABLET | Freq: Four times a day (QID) | ORAL | Status: DC | PRN

## 2015-07-13 MED ORDER — PREDNISONE 20 MG PO TABS: ORAL_TABLET | ORAL | Status: DC

## 2015-07-13 NOTE — Patient Instructions (Signed)
As you can see from the x-ray, there is some diffuse scarring on both sides of the chest. I want the pulmonary doctors to evaluate for diseases like sarcoid which can cause some scarring.  Aborted some pain medicine and prednisone in the meantime.

## 2015-07-13 NOTE — Progress Notes (Addendum)
 @  This chart was scribed for Timothy Sidle, MD by Andrew Au, ED Scribe. This patient was seen in room 1 and the patient's care was started at 3:42 PM.  Patient ID: COYLE STORDAHL MRN: [id number], DOB: November 03, 1985, 28 y.o. Date of Encounter: 07/13/2015, 3:42 PM  Primary Physician: Tally Due, MD  Chief Complaint:  Chief Complaint  Patient presents with   Chest Pain    x 1 week (constant)-currently having pain now (pain is a 6 out of 10)    HPI: 29 y.o. year old male with history below presents with mid to right sided, CP that began 1 week ago. Pt reports CP initially felt like a pulled muscle but progressively worsened throughout the week. No injury. He has sharp pain with pressure when bending forward and taking deep breaths, but denies SOB. He also locates pain to his right shoulder blade. He has tried ibuprofen with temporarily relief to pain. Pt has hx of pericarditis and reports current pain to be similar.  He denies leg pain, swelling, or numbness. He denies long distance traveling. He is not a smoker.   Reviewing the past records, we see that this patient has made multiple trips the emergency room for pain that is described in exactly the same way that he is describing his pain today.   Past Medical History  Diagnosis Date   Pericarditis      Home Meds: Prior to Admission medications   Medication Sig Start Date End Date Taking? Authorizing Provider  ibuprofen (ADVIL,MOTRIN) 200 MG tablet Take 600 mg by mouth every 6 (six) hours as needed for mild pain.   Yes Historical Provider, MD  azithromycin (ZITHROMAX) 250 MG tablet Take 2 tabs PO x 1 dose, then 1 tab PO QD x 4 days Patient not taking: Reported on 07/13/2015 10/05/14   Peyton Najjar, MD  benzonatate (TESSALON) 100 MG capsule Take 1-2 capsules (100-200 mg total) by mouth 3 (three) times daily as needed. Patient not taking: Reported on 07/13/2015 10/05/14   Peyton Najjar, MD  HYDROcodone-homatropine  [facility]) 5-1.5 MG/5ML syrup Take 5 mLs by mouth every 4 (four) hours as needed. Patient not taking: Reported on 07/13/2015 10/05/14   Peyton Najjar, MD    Allergies: No Known Allergies  Social History   Social History   Marital Status: Single    Spouse Name: N/A   Number of Children: N/A   Years of Education: N/A   Occupational History   bartender    Social History Main Topics   Smoking status: Former Smoker -- 0.10 packs/day for 9 years    Types: Cigarettes    Quit date: 03/28/2014   Smokeless tobacco: Former Neurosurgeon    Types: Snuff   Alcohol Use: Yes     Comment: occassional   Drug Use: No   Sexual Activity: Yes    Birth Control/ Protection: Condom   Other Topics Concern   Not on file   Social History Narrative    Review of Systems: Constitutional: negative for chills, fever, night sweats, weight changes, or fatigue  HEENT: negative for vision changes, hearing loss, congestion, rhinorrhea, ST, epistaxis, or sinus pressure Cardiovascular: negative for chest pain or palpitations Respiratory: negative for hemoptysis, wheezing, shortness of breath, or cough Abdominal: negative for abdominal pain, nausea, vomiting, diarrhea, or constipation Dermatological: negative for rash Neurologic: negative for headache, dizziness, or syncope All other systems reviewed and are otherwise negative with the exception to those above and in the HPI.  Physical Exam: Blood pressure 130/60, pulse 85, temperature 97.6 F (36.4 C), temperature source Oral, resp. rate 18, height 5' 9.25" (1.759 m), weight 148 lb 4 oz (67.246 kg), SpO2 98 %., Body mass index is 21.73 kg/(m^2). General: Well developed, well nourished, in no acute distress. Head: Normocephalic, atraumatic, eyes without discharge, sclera non-icteric, nares are without discharge. Bilateral auditory canals clear, TM's are without perforation, pearly grey and translucent with reflective cone of light bilaterally. Oral cavity  moist, posterior pharynx without exudate, erythema, peritonsillar abscess, or post nasal drip.  Neck: Supple. No thyromegaly. Full ROM. No lymphadenopathy. Lungs: Clear bilaterally to auscultation without wheezes, rales, or rhonchi. Breathing is unlabored. Heart: RRR with S1 S2. No murmurs, rubs, or gallops appreciated. Patient has surgical scars corresponding to a pericardial window that he describes as having been performed many years ago.  The inspection of the chest is a mild pectus excavatum Abdomen: Soft, non-tender, non-distended with normoactive bowel sounds. No hepatomegaly. No rebound/guarding. No obvious abdominal masses. Msk:  Strength and tone normal for age. Extremities/Skin: Warm and dry. No clubbing or cyanosis. No edema. No rashes or suspicious lesions. Neuro: Alert and oriented X 3. Moves all extremities spontaneously. Gait is normal. CNII-XII grossly in tact. Psych:  Responds to questions appropriately with a normal affect.   Labs: [facility] reading (PRIMARY) by  Dr. Milus GlazierLauenstein. CXR- Bilateral diffuse scarring of the bronchial tubes    ASSESSMENT AND PLAN:  29 y.o. year old male with unusual chest x-ray and recurrent right-sided chest pain which may represent sarcoid. This chart was scribed in my presence and reviewed by me personally.    ICD-9-CM ICD-10-CM   1. Anterior chest wall pain 786.52 R07.1 EKG 12-Lead     DG Chest 2 View     Ambulatory referral to Pulmonology     HYDROcodone-acetaminophen (NORCO) 5-325 MG tablet     predniSONE (DELTASONE) 20 MG tablet  2. Abnormal chest x-ray 793.2 R93.8 Ambulatory referral to Pulmonology    Signed, Timothy SidleKurt Lauenstein, MD 07/13/2015 3:42 PM  \

## 2015-07-19 ENCOUNTER — Ambulatory Visit (INDEPENDENT_AMBULATORY_CARE_PROVIDER_SITE_OTHER): Payer: BLUE CROSS/BLUE SHIELD | Admitting: Physician Assistant

## 2015-07-19 DIAGNOSIS — M25512 Pain in left shoulder: Secondary | ICD-10-CM | POA: Diagnosis not present

## 2015-07-19 DIAGNOSIS — M79601 Pain in right arm: Secondary | ICD-10-CM

## 2015-07-19 MED ORDER — IBUPROFEN 800 MG PO TABS
800.0000 mg | ORAL_TABLET | Freq: Four times a day (QID) | ORAL | Status: DC | PRN
Start: 2015-07-19 — End: 2016-11-04

## 2015-07-19 MED ORDER — CYCLOBENZAPRINE HCL 10 MG PO TABS: 10.0000 mg | ORAL_TABLET | Freq: Three times a day (TID) | ORAL | Status: DC | PRN
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
## 2015-07-19 NOTE — Patient Instructions (Signed)

## 2016-11-04 ENCOUNTER — Ambulatory Visit (INDEPENDENT_AMBULATORY_CARE_PROVIDER_SITE_OTHER): Payer: BLUE CROSS/BLUE SHIELD | Admitting: Physician Assistant

## 2016-11-04 VITALS — BP 122/72 | HR 83 | Temp 97.8°F | Resp 17 | Ht 69.5 in | Wt 143.0 lb

## 2016-11-04 DIAGNOSIS — H1032 Unspecified acute conjunctivitis, left eye: Secondary | ICD-10-CM

## 2016-11-04 MED ORDER — OFLOXACIN 0.3 % OP SOLN: OPHTHALMIC | 0 refills | Status: AC

## 2016-11-04 NOTE — Progress Notes (Signed)
[facility] [address] [place] [address] [phone]  Date:  11/04/2016   Name:  [name]   DOB:  [birth year]   MRN:  [id number]  PCP:  Tally Due, MD    History of Present Illness:  [name] is a 31 y.o. male patient who presents to [facility] for left sided eye pain.   This morning, felt like a scratch or foreign body sensation.  Her eye is watering heavy.  No change in vision that he can tell though.  No fever.  He has congestion  This morning as well.  The eye is painful, hurts with movement, and swollen.  He did hot water washes which made it more irritable.  He wears contacts and removes nightly.  They are monthlies.  No vigorous eye rubbing or debris.   Patient Active Problem List   Diagnosis Date Noted  . Pleurisy 06/15/2014    Past Medical History:  Diagnosis Date  . Pericarditis     Past Surgical History:  Procedure Laterality Date  . APPENDECTOMY    . COSMETIC SURGERY     pectus excavatum  . pericardial window  2011   for pericarditis  . TONSILLECTOMY      Social History  Substance Use Topics  . Smoking status: Former Smoker    Packs/day: 0.10    Years: 9.00    Types: Cigarettes    Quit date: 03/28/2014  . Smokeless tobacco: Former Neurosurgeon    Types: Snuff  . Alcohol use Yes     Comment: occassional    Family History  Problem Relation Age of Onset  . Hyperlipidemia Father   . Asthma Father   . Hyperlipidemia Paternal Grandfather   . Hypertension Paternal Grandfather   . Thyroid disease Mother   . Asthma Sister   . Colon cancer Maternal Grandmother     No Known Allergies  Medication list has been reviewed and updated.  No current outpatient prescriptions on file prior to visit.   No current facility-administered medications on file prior to visit.     ROS ROS otherwise unremarkable unless listed above.   Physical Examination: BP 122/72 (BP Location: Right Arm, Patient Position: Sitting, Cuff Size:  Normal)   Pulse 83   Temp 97.8 F (36.6 C) (Oral)   Resp 17   Ht 5' 9.5" (1.765 m)   Wt 143 lb (64.9 kg)   SpO2 97%   BMI 20.81 kg/m  Ideal Body Weight: Weight in (lb) to have BMI = 25: 171.4  Physical Exam  Constitutional: He is oriented to person, place, and time. He appears well-developed and well-nourished. No distress.  HENT:  Head: Normocephalic and atraumatic.  Eyes: Conjunctivae and EOM are normal. Pupils are equal, round, and reactive to light.  Fundoscopic exam:      The right eye shows no exudate.       The left eye shows no exudate.  Slit lamp exam:      The left eye shows no corneal abrasion, no corneal ulcer, no foreign body, no hyphema, no hypopyon, no fluorescein uptake and no anterior chamber bulge.  injected conjunctiva. Upper eyelid with swelling.    Cardiovascular: Normal rate.   Pulmonary/Chest: Effort normal. No respiratory distress.  Neurological: He is alert and oriented to person, place, and time.  Skin: Skin is warm and dry. He is not diaphoretic.  Psychiatric: He has a normal mood and affect. His behavior is normal.  Assessment and Plan: [name] is a 31 y.o. male who is here today for cc of left eye pain and swelling Advised cool compresses. Will cover empirically for this contact wearer.  Appears likely conjunctivitis foreign body.  This could not currently be detected.  Advised to return if his sxs did not improve within the 48-72 hours.  Also discussed signs to warrant immediate return. Acute conjunctivitis of left eye, unspecified acute conjunctivitis type - Plan: ofloxacin (OCUFLOX) 0.3 % ophthalmic solution  Trena PlattStephanie Jerico Grisso, PA-C [facility] 2/11/20187:11 PM

## 2016-11-04 NOTE — Patient Instructions (Addendum)
You can irrigate just piror to instilling the drops. If your swelling and symptoms worsen, please return.   Bacterial Conjunctivitis Introduction Bacterial conjunctivitis is an infection of your conjunctiva. This is the clear membrane that covers the white part of your eye and the inner surface of your eyelid. This condition can make your eye:  Red or pink.  Itchy. This condition is caused by bacteria. This condition spreads very easily from person to person (is contagious) and from one eye to the other eye. Follow these instructions at home: Medicines  Take or apply your antibiotic medicine as told by your doctor. Do not stop taking or applying the antibiotic even if you start to feel better.  Take or apply over-the-counter and prescription medicines only as told by your doctor.  Do not touch your eyelid with the eye drop bottle or the ointment tube. Managing discomfort  Wipe any fluid from your eye with a warm, wet washcloth or a cotton ball.  Place a cool, clean washcloth on your eye. Do this for 10-20 minutes, 3-4 times per day. General instructions  Do not wear contact lenses until the irritation is gone. Wear glasses until your doctor says it is okay to wear contacts.  Do not wear eye makeup until your symptoms are gone. Throw away any old makeup.  Change or wash your pillowcase every day.  Do not share towels or washcloths with anyone.  Wash your hands often with soap and water. Use paper towels to dry your hands.  Do not touch or rub your eyes.  Do not drive or use heavy machinery if your vision is blurry. Contact a doctor if:  You have a fever.  Your symptoms do not get better after 10 days. Get help right away if:  You have a fever and your symptoms suddenly get worse.  You have very bad pain when you move your eye.  Your face:  Hurts.  Is red.  Is swollen.  You have sudden loss of vision. This information is not intended to replace advice given to  you by your health care provider. Make sure you discuss any questions you have with your health care provider. Document Released: 06/23/2008 Document Revised: 02/20/2016 Document Reviewed: 06/27/2015  2017 Elsevier     IF you received an x-ray today, you will receive an invoice from [facility]. Please contact [facility] at [phone] with questions or concerns regarding your invoice.   IF you received labwork today, you will receive an invoice from [facility]. Please contact [facility] at [phone] with questions or concerns regarding your invoice.   Our billing staff will not be able to assist you with questions regarding bills from these companies.  You will be contacted with the lab results as soon as they are available. The fastest way to get your results is to activate your My Chart account. Instructions are located on the last page of this paperwork. If you have not heard from us regarding the results in 2 weeks, please contact this office.

## 2017-11-16 DIAGNOSIS — S5001XA Contusion of right elbow, initial encounter: Secondary | ICD-10-CM | POA: Diagnosis not present

## 2017-11-16 DIAGNOSIS — S53401A Unspecified sprain of right elbow, initial encounter: Secondary | ICD-10-CM | POA: Diagnosis not present

## 2017-12-14 ENCOUNTER — Emergency Department (HOSPITAL_COMMUNITY)
Admission: EM | Admit: 2017-12-14 | Discharge: 2017-12-14 | Disposition: A | Discharge status: Home / Self Care | Payer: Managed Care, Other (non HMO) | Attending: Emergency Medicine | Admitting: Emergency Medicine

## 2017-12-14 ENCOUNTER — Other Ambulatory Visit: Payer: Self-pay

## 2017-12-14 ENCOUNTER — Encounter (HOSPITAL_COMMUNITY): Payer: Self-pay | Admitting: Emergency Medicine

## 2017-12-14 DIAGNOSIS — Z87891 Personal history of nicotine dependence: Secondary | ICD-10-CM | POA: Insufficient documentation

## 2017-12-14 DIAGNOSIS — S61011A Laceration without foreign body of right thumb without damage to nail, initial encounter: Secondary | ICD-10-CM

## 2017-12-14 DIAGNOSIS — Z79899 Other long term (current) drug therapy: Secondary | ICD-10-CM | POA: Insufficient documentation

## 2017-12-14 DIAGNOSIS — Y990 Civilian activity done for income or pay: Secondary | ICD-10-CM | POA: Insufficient documentation

## 2017-12-14 DIAGNOSIS — W25XXXA Contact with sharp glass, initial encounter: Secondary | ICD-10-CM | POA: Insufficient documentation

## 2017-12-14 DIAGNOSIS — Y939 Activity, unspecified: Secondary | ICD-10-CM | POA: Insufficient documentation

## 2017-12-14 DIAGNOSIS — Y929 Unspecified place or not applicable: Secondary | ICD-10-CM | POA: Insufficient documentation

## 2017-12-14 MED ORDER — LIDOCAINE HCL (PF) 1 % IJ SOLN
5.0000 mL | Freq: Once | INTRAMUSCULAR | Status: AC
  Administered 2017-12-14: 5 mL
  Filled 2017-12-14: qty 5

## 2017-12-14 NOTE — Discharge Instructions (Signed)
You were seen in the emergency department today for a laceration to your thumb.  3 stitches were placed in the laceration.  You will need to keep the area dry for the next 24 hours.  Following 24 hours you may get the area wet, but do not soak it.  You will need to have the stitches removed and the wound rechecked in 7 days.  You may return to the emergency department, go to an urgent care, or see your primary care doctor for this.  Return to the emergency department sooner for signs of infection including redness, increased swelling, drainage from the wound, fever, or any other concerns that you may have.

## 2017-12-14 NOTE — ED Notes (Signed)
Pt verbalizes understanding of d/c instructions. Pt ambulatory at d/c with all belongings.   

## 2017-12-14 NOTE — ED Notes (Signed)
Pt's laceration irrigated and cleaned.
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
## 2017-12-14 NOTE — ED Notes (Signed)
ED Provider at bedside. 
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
## 2017-12-15 ENCOUNTER — Emergency Department (HOSPITAL_COMMUNITY)
Admission: EM | Admit: 2017-12-15 | Discharge: 2017-12-15 | Disposition: A | Discharge status: Home / Self Care | Payer: Self-pay | Attending: Emergency Medicine | Admitting: Emergency Medicine

## 2017-12-15 ENCOUNTER — Other Ambulatory Visit: Payer: Self-pay

## 2017-12-15 ENCOUNTER — Emergency Department (HOSPITAL_COMMUNITY): Payer: Self-pay

## 2017-12-15 ENCOUNTER — Encounter (HOSPITAL_COMMUNITY): Payer: Self-pay | Admitting: *Deleted

## 2017-12-15 DIAGNOSIS — F10920 Alcohol use, unspecified with intoxication, uncomplicated: Secondary | ICD-10-CM

## 2017-12-15 DIAGNOSIS — F10120 Alcohol abuse with intoxication, uncomplicated: Secondary | ICD-10-CM | POA: Insufficient documentation

## 2017-12-15 DIAGNOSIS — R918 Other nonspecific abnormal finding of lung field: Secondary | ICD-10-CM | POA: Diagnosis not present

## 2017-12-15 DIAGNOSIS — R410 Disorientation, unspecified: Secondary | ICD-10-CM

## 2017-12-15 DIAGNOSIS — Z87891 Personal history of nicotine dependence: Secondary | ICD-10-CM | POA: Insufficient documentation

## 2017-12-15 DIAGNOSIS — R4182 Altered mental status, unspecified: Secondary | ICD-10-CM | POA: Diagnosis not present

## 2017-12-15 DIAGNOSIS — R404 Transient alteration of awareness: Secondary | ICD-10-CM | POA: Insufficient documentation

## 2017-12-15 LAB — COMPREHENSIVE METABOLIC PANEL
ALBUMIN: 4.8 g/dL (ref 3.5–5.0)
ALT: 65 U/L — ABNORMAL HIGH (ref 17–63)
AST: 61 U/L — AB (ref 15–41)
Alkaline Phosphatase: 98 U/L (ref 38–126)
Anion gap: 13 (ref 5–15)
BUN: 7 mg/dL (ref 6–20)
CHLORIDE: 101 mmol/L (ref 101–111)
CO2: 25 mmol/L (ref 22–32)
Calcium: 9.1 mg/dL (ref 8.9–10.3)
Creatinine, Ser: 0.78 mg/dL (ref 0.61–1.24)
GFR calc Af Amer: >60 mL/min (ref >60)
GFR calc non Af Amer: >60 mL/min (ref >60)
GLUCOSE: 119 mg/dL — AB (ref 65–99)
POTASSIUM: 3.6 mmol/L (ref 3.5–5.1)
Sodium: 139 mmol/L (ref 135–145)
TOTAL PROTEIN: 7.8 g/dL (ref 6.5–8.1)
Total Bilirubin: 0.8 mg/dL (ref 0.3–1.2)

## 2017-12-15 LAB — I-STAT TROPONIN, ED: Troponin i, poc: 0 ng/mL (ref 0.00–0.08)

## 2017-12-15 LAB — URINALYSIS, COMPLETE (UACMP) WITH MICROSCOPIC
Bilirubin Urine: NEGATIVE
GLUCOSE, UA: NEGATIVE mg/dL
HGB URINE DIPSTICK: NEGATIVE
KETONES UR: NEGATIVE mg/dL
Leukocytes, UA: NEGATIVE
Nitrite: NEGATIVE
PROTEIN: NEGATIVE mg/dL
Specific Gravity, Urine: 1.006 (ref 1.005–1.030)
pH: 6 (ref 5.0–8.0)

## 2017-12-15 LAB — ETHANOL: Alcohol, Ethyl (B): 318 mg/dL (ref <10)

## 2017-12-15 LAB — CBC WITH DIFFERENTIAL/PLATELET
Basophils Absolute: 0 10*3/uL (ref 0.0–0.1)
Basophils Relative: 1 %
EOS PCT: 3 %
Eosinophils Absolute: 0.2 10*3/uL (ref 0.0–0.7)
HCT: 50.5 % (ref 39.0–52.0)
Hemoglobin: 18.2 g/dL — ABNORMAL HIGH (ref 13.0–17.0)
LYMPHS ABS: 3 10*3/uL (ref 0.7–4.0)
LYMPHS PCT: 42 %
MCH: 32.5 pg (ref 26.0–34.0)
MCHC: 36 g/dL (ref 30.0–36.0)
MCV: 90.2 fL (ref 78.0–100.0)
MONO ABS: 0.5 10*3/uL (ref 0.1–1.0)
Monocytes Relative: 7 %
Neutro Abs: 3.5 10*3/uL (ref 1.7–7.7)
Neutrophils Relative %: 47 %
PLATELETS: 255 10*3/uL (ref 150–400)
RBC: 5.6 MIL/uL (ref 4.22–5.81)
RDW: 12.4 % (ref 11.5–15.5)
WBC: 7.2 10*3/uL (ref 4.0–10.5)

## 2017-12-15 LAB — RAPID URINE DRUG SCREEN, HOSP PERFORMED
AMPHETAMINES: NOT DETECTED
BARBITURATES: NOT DETECTED
BENZODIAZEPINES: NOT DETECTED
Cocaine: NOT DETECTED
Opiates: NOT DETECTED
Tetrahydrocannabinol: POSITIVE — AB

## 2017-12-15 LAB — I-STAT CG4 LACTIC ACID, ED: Lactic Acid, Venous: 3.31 mmol/L (ref 0.5–1.9)

## 2017-12-15 LAB — AMMONIA: Ammonia: 58 umol/L — ABNORMAL HIGH (ref 9–35)

## 2017-12-15 LAB — ACETAMINOPHEN LEVEL

## 2017-12-15 LAB — SALICYLATE LEVEL: Salicylate Lvl: <7 mg/dL (ref 2.8–30.0)

## 2017-12-15 LAB — CBG MONITORING, ED: Glucose-Capillary: 92 mg/dL (ref 65–99)

## 2017-12-15 NOTE — ED Notes (Signed)
ED Provider at bedside. 

## 2017-12-15 NOTE — ED Notes (Signed)
Tray, RN removed dressing and cleaned wound. Site is intact.

## 2017-12-15 NOTE — ED Notes (Signed)
Effie ShyWentz, MD notified of alcohol level.
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
## 2017-12-15 NOTE — ED Provider Notes (Signed)
Patient placed in Quick Look pathway, seen and evaluated   Chief Complaint: syncope  HPI:   Pt found unresponsive on kitchen floor, LSN yesterday afternoon. Pt cannot remember prodromal symptoms or events. Denies any pain. Occasional ETOH and marijuana use. No IVDU. H/o pericarditis.   ROS: Fevers, HA, CP, SOB, pain anywhere.   Physical Exam:   Gen: Odd affect  Neuro: Awake and Alert  Skin: Warm    Focused Exam: Ax o to self, place, time but not specific events. Constricted but reactive pupils, symmetric. Atraumatic head. RRR. 2+ Dp and radial pulses bilaterally. Lungs CTAB. 5/5/ strength with hand grip and ankle F/E. Sensation grossly intact in hands and feet. Old chest surgical scars, well healed.   Initiation of care has begun. The patient has been counseled on the process, plan, and necessity for staying for the completion/evaluation, and the remainder of the medical screening examination    Jerrell MylarGibbons, Claudia J, PA-C 12/15/17 1900    Mancel BaleWentz, Elliott, MD 12/15/17 2105

## 2017-12-15 NOTE — ED Triage Notes (Signed)
To ED for eval of confusion and bizarre behavior. Per pts friend pt was found unresponsive in the kitchen floor. Friends moved pt to couch where 'it took a long time to wake him up'. Pt lsn yesterday afternoon. Pt aware he's in triage. Pt able to give writer his hx- mitral valve prolapse, pericarditis. Pt strength is strong and equal. Pupils equal and reactive. Speech is clear.

## 2017-12-15 NOTE — ED Notes (Signed)
Patient transported to CT 

## 2017-12-15 NOTE — ED Provider Notes (Signed)
[facility] EMERGENCY DEPARTMENT Provider Note   CSN: [id number] Arrival date & time: 12/15/17  1824     History   Chief Complaint No chief complaint on file.   HPI [name] is a 32 y.o. male.  The patient was found on the floor by his roommate, at 5:45 PM tonight.  He was curled up in a fetal position, but not unconscious.  His roommate was able to get him to a couch, and communicate with him.  At that time the patient was very confused and did not recognize his roommate or know where he was.  Earlier today the patient had talked to his father, on the phone, 3 times.  His father reports that he became progressively more confused over the course of the day.  Both his father and his roommate are here in the ED to give history.  They report that the patient has improved dramatically since earlier this evening.  No prior similar problems.  The patient feels like he is well but cannot remember things from earlier today.  He complains of a knot in the region of the right posterior scalp.  No history of seizures.  He denies recent fever, chills, cough, shortness of breath, focal weakness or paresthesia.  There are no other known modifying factors.    HPI  Past Medical History:  Diagnosis Date  . Pericarditis     Patient Active Problem List   Diagnosis Date Noted  . Pleurisy 06/15/2014    Past Surgical History:  Procedure Laterality Date  . APPENDECTOMY    . COSMETIC SURGERY     pectus excavatum  . pericardial window  2011   for pericarditis  . TONSILLECTOMY         Home Medications    Prior to Admission medications   Medication Sig Start Date End Date Taking? Authorizing Provider  ibuprofen (ADVIL,MOTRIN) 600 MG tablet Take 400 mg by mouth as needed.   Yes [provider]  ofloxacin (OCUFLOX) 0.3 % ophthalmic solution Instill 2 drops every 4 hours for the first 2 days, then instill 2 drops 4 times daily for an additional 5 days. Patient  not taking: Reported on 12/15/2017 11/04/16   Garnetta Buddy, PA    Family History Family History  Problem Relation Age of Onset  . Hyperlipidemia Father   . Asthma Father   . Hyperlipidemia Paternal Grandfather   . Hypertension Paternal Grandfather   . Thyroid disease Mother   . Asthma Sister   . Colon cancer Maternal Grandmother     Social History Social History   Tobacco Use  . Smoking status: Former Smoker    Packs/day: 0.10    Years: 9.00    Pack years: 0.90    Types: Cigarettes    Last attempt to quit: 03/28/2014    Years since quitting: 3.7  . Smokeless tobacco: Former Neurosurgeon    Types: Snuff  Substance Use Topics  . Alcohol use: Yes    Comment: occassional  . Drug use: No     Allergies   Patient has no known allergies.   Review of Systems Review of Systems  All other systems reviewed and are negative.    Physical Exam Updated Vital Signs BP (!) 135/94   Pulse 95   Temp 98 F (36.7 C) (Oral)   Resp 15   SpO2 99%   Physical Exam  Constitutional: He is oriented to person, place, and time. He appears well-developed and well-nourished.  HENT:  Head: Normocephalic and atraumatic.  Right Ear: External ear normal.  Left Ear: External ear normal.  Eyes: Conjunctivae and EOM are normal. Pupils are equal, round, and reactive to light.  Neck: Normal range of motion and phonation normal. Neck supple.  Cardiovascular: Normal rate, regular rhythm and normal heart sounds.  Pulmonary/Chest: Effort normal and breath sounds normal. He exhibits no bony tenderness.  Abdominal: Soft. There is no tenderness.  Musculoskeletal: Normal range of motion.  Neurological: He is alert and oriented to person, place, and time. No cranial nerve deficit or sensory deficit. He exhibits normal muscle tone. Coordination normal.  Skin: Skin is warm, dry and intact.  Psychiatric: He has a normal mood and affect. His behavior is normal. Judgment and thought content normal.  Nursing  note and vitals reviewed.    ED Treatments / Results  Labs (all labs ordered are listed, but only abnormal results are displayed) Labs Reviewed  COMPREHENSIVE METABOLIC PANEL - Abnormal; Notable for the following components:      Result Value   Glucose, Bld 119 (*)    AST 61 (*)    ALT 65 (*)    All other components within normal limits  CBC WITH DIFFERENTIAL/PLATELET - Abnormal; Notable for the following components:   Hemoglobin 18.2 (*)    All other components within normal limits  URINALYSIS, COMPLETE (UACMP) WITH MICROSCOPIC - Abnormal; Notable for the following components:   Color, Urine STRAW (*)    All other components within normal limits  AMMONIA - Abnormal; Notable for the following components:   Ammonia 58 (*)    All other components within normal limits  ETHANOL - Abnormal; Notable for the following components:   Alcohol, Ethyl (B) 318 (*)    All other components within normal limits  RAPID URINE DRUG SCREEN, HOSP PERFORMED - Abnormal; Notable for the following components:   Tetrahydrocannabinol POSITIVE (*)    All other components within normal limits  ACETAMINOPHEN LEVEL - Abnormal; Notable for the following components:   Acetaminophen (Tylenol), Serum <10 (*)    All other components within normal limits  I-STAT CG4 LACTIC ACID, ED - Abnormal; Notable for the following components:   Lactic Acid, Venous 3.31 (*)    All other components within normal limits  CULTURE, BLOOD (ROUTINE X 2)  CULTURE, BLOOD (ROUTINE X 2)  SALICYLATE LEVEL  CBG MONITORING, ED  I-STAT TROPONIN, ED  CBG MONITORING, ED    EKG  EKG Interpretation  Date/Time:  Wednesday December 15 2017 18:41:41 EDT Ventricular Rate:  107 PR Interval:  186 QRS Duration: 94 QT Interval:  346 QTC Calculation: 461 R Axis:   -50 Text Interpretation:  Sinus tachycardia Possible Left atrial enlargement Left axis deviation Left ventricular hypertrophy Nonspecific ST and T wave abnormality Abnormal ECG  Since last tracing rate faster Confirmed by Mancel Bale [phone]) on 12/15/2017 9:16:36 PM       Radiology Dg Chest 2 View  Result Date: 12/15/2017 CLINICAL DATA:  Found unresponsive EXAM: CHEST - 2 VIEW COMPARISON:  Chest radiograph 07/13/2015 FINDINGS: The lungs are hyperinflated. There is pectus excavatum. No focal airspace consolidation or pulmonary edema. Normal cardiomediastinal contours. IMPRESSION: Hyperinflated lungs and pectus excavatum. No focal airspace disease. Electronically Signed   By: Deatra Robinson M.D.   On: 12/15/2017 20:34   Ct Head Wo Contrast  Result Date: 12/15/2017 CLINICAL DATA:  Altered mental status.  Patient found down. EXAM: CT HEAD WITHOUT CONTRAST TECHNIQUE: Contiguous axial images were obtained from the  base of the skull through the vertex without intravenous contrast. COMPARISON:  None. FINDINGS: Brain: No mass lesion, intraparenchymal hemorrhage or extra-axial collection. No evidence of acute cortical infarct. Normal appearance of the brain parenchyma and extra axial spaces for age. Vascular: No hyperdense vessel or unexpected vascular calcification. Skull: Normal visualized skull base, calvarium and extracranial soft tissues. Sinuses/Orbits: No sinus fluid levels or advanced mucosal thickening. No mastoid effusion. Normal orbits. IMPRESSION: Normal head CT. Electronically Signed   By: Deatra RobinsonKevin  Herman M.D.   On: 12/15/2017 22:42    Procedures Procedures (including critical care time)  Medications Ordered in ED Medications - No data to display   Initial Impression / Assessment and Plan / ED Course  I have reviewed the triage vital signs and the nursing notes.  Pertinent labs & imaging results that were available during my care of the patient were reviewed by me and considered in my medical decision making (see chart for details).  Clinical Course as of Dec 15 2253  Wed Dec 15, 2017  2251 Evaluation for altered mental status: Alcohol level high, THC present  in urine drug screen, ammonia level high, mild liver function test transaminase elevation, urinalysis normal, i-STAT troponin normal, i-STAT lactate elevated 3.3  [EW]    Clinical Course User Index [EW] Mancel BaleWentz, Niall Illes, MD     Patient Vitals for the past 24 hrs:  BP Temp Temp src Pulse Resp SpO2  12/15/17 2045 (!) 135/94 - - 95 15 99 %  12/15/17 1832 (!) 140/98 98 F (36.7 C) Oral 100 16 100 %    10:49 PM Reevaluation with update and discussion. After initial assessment and treatment, an updated evaluation reveals no change in clinical status.  Findings discussed with patient and all questions were answered. Mancel BaleElliott Chimere Klingensmith   MDM-transient altered mental status, with confusion, and fall to floor.  No significant head injury.  Alcohol level very high, which is the likely explanation for his altered mental status and confusion.  Considered seizure is because however patient has never been known to have a seizure and does not have any particular findings that are suggestive for it..  Mild inflammatory changes liver with elevated ammonia level.  Doubt hepatic encephalopathy, serious bacterial infection or impending vascular collapse.   Final Clinical Impressions(s) / ED Diagnoses   Final diagnoses:  Transient alteration of awareness  Alcoholic intoxication without complication [facility](HCC)  Confusion    ED Discharge Orders    None       Mancel BaleWentz, Jerrian Mells, MD 12/15/17 2255

## 2017-12-17 ENCOUNTER — Ambulatory Visit: Payer: BLUE CROSS/BLUE SHIELD | Admitting: Physician Assistant

## 2017-12-17 ENCOUNTER — Telehealth (HOSPITAL_BASED_OUTPATIENT_CLINIC_OR_DEPARTMENT_OTHER): Payer: Self-pay | Admitting: *Deleted

## 2017-12-17 LAB — BLOOD CULTURE ID PANEL (REFLEXED)
Acinetobacter baumannii: NOT DETECTED
CANDIDA ALBICANS: NOT DETECTED
CANDIDA GLABRATA: NOT DETECTED
CANDIDA KRUSEI: NOT DETECTED
CANDIDA TROPICALIS: NOT DETECTED
Candida parapsilosis: NOT DETECTED
ENTEROBACTER CLOACAE COMPLEX: NOT DETECTED
ENTEROBACTERIACEAE SPECIES: NOT DETECTED
ESCHERICHIA COLI: NOT DETECTED
Enterococcus species: NOT DETECTED
Haemophilus influenzae: NOT DETECTED
KLEBSIELLA PNEUMONIAE: NOT DETECTED
Klebsiella oxytoca: NOT DETECTED
Listeria monocytogenes: NOT DETECTED
Methicillin resistance: NOT DETECTED
Neisseria meningitidis: NOT DETECTED
PROTEUS SPECIES: NOT DETECTED
Pseudomonas aeruginosa: NOT DETECTED
STAPHYLOCOCCUS SPECIES: DETECTED — AB
STREPTOCOCCUS AGALACTIAE: NOT DETECTED
STREPTOCOCCUS PYOGENES: NOT DETECTED
Serratia marcescens: NOT DETECTED
Staphylococcus aureus (BCID): NOT DETECTED
Streptococcus pneumoniae: NOT DETECTED
Streptococcus species: NOT DETECTED

## 2017-12-18 LAB — CULTURE, BLOOD (ROUTINE X 2): SPECIAL REQUESTS: ADEQUATE

## 2017-12-19 ENCOUNTER — Telehealth: Payer: Self-pay

## 2017-12-19 NOTE — Progress Notes (Signed)
ED Antimicrobial Stewardship Positive Culture Follow Up   [name] is an 32 y.o. male who presented to [facility] on 12/15/2017 with a chief complaint of No chief complaint on file.   Recent Results (from the past 720 hour(s))  Blood culture (routine x 2)     Status: Abnormal   Collection Time: 12/15/17  8:52 PM  Result Value Ref Range Status   Specimen Description BLOOD RIGHT ANTECUBITAL  Final   Special Requests   Final    BOTTLES DRAWN AEROBIC AND ANAEROBIC Blood Culture adequate volume   Culture  Setup Time   Final    GRAM POSITIVE COCCI IN CLUSTERS ANAEROBIC BOTTLE ONLY CRITICAL RESULT CALLED TO, READ BACK BY AND VERIFIED WITH: K. Ashok Cordia [id number] 12/17/2017 T. TYSOR    Culture (A)  Final    STAPHYLOCOCCUS SPECIES (COAGULASE NEGATIVE) THE SIGNIFICANCE OF ISOLATING THIS ORGANISM FROM A SINGLE SET OF BLOOD CULTURES WHEN MULTIPLE SETS ARE DRAWN IS UNCERTAIN. PLEASE NOTIFY THE MICROBIOLOGY DEPARTMENT WITHIN ONE WEEK IF SPECIATION AND SENSITIVITIES ARE REQUIRED. Performed at [facility], [address] [place], [place] [address]    Report Status 12/18/2017 FINAL  Final  Blood Culture ID Panel (Reflexed)     Status: Abnormal   Collection Time: 12/15/17  8:52 PM  Result Value Ref Range Status   Enterococcus species NOT DETECTED NOT DETECTED Final   Listeria monocytogenes NOT DETECTED NOT DETECTED Final   Staphylococcus species DETECTED (A) NOT DETECTED Final    Comment: Methicillin (oxacillin) susceptible coagulase negative staphylococcus. Possible blood culture contaminant (unless isolated from more than one blood culture draw or clinical case suggests pathogenicity). No antibiotic treatment is indicated for blood  culture contaminants. CRITICAL RESULT CALLED TO, READ BACK BY AND VERIFIED WITH: Arty Baumgartner [id number] 12/17/2017 T. TYSOR    Staphylococcus aureus NOT DETECTED NOT DETECTED Final   Methicillin resistance NOT DETECTED NOT DETECTED Final   Streptococcus species NOT  DETECTED NOT DETECTED Final   Streptococcus agalactiae NOT DETECTED NOT DETECTED Final   Streptococcus pneumoniae NOT DETECTED NOT DETECTED Final   Streptococcus pyogenes NOT DETECTED NOT DETECTED Final   Acinetobacter baumannii NOT DETECTED NOT DETECTED Final   Enterobacteriaceae species NOT DETECTED NOT DETECTED Final   Enterobacter cloacae complex NOT DETECTED NOT DETECTED Final   Escherichia coli NOT DETECTED NOT DETECTED Final   Klebsiella oxytoca NOT DETECTED NOT DETECTED Final   Klebsiella pneumoniae NOT DETECTED NOT DETECTED Final   Proteus species NOT DETECTED NOT DETECTED Final   Serratia marcescens NOT DETECTED NOT DETECTED Final   Haemophilus influenzae NOT DETECTED NOT DETECTED Final   Neisseria meningitidis NOT DETECTED NOT DETECTED Final   Pseudomonas aeruginosa NOT DETECTED NOT DETECTED Final   Candida albicans NOT DETECTED NOT DETECTED Final   Candida glabrata NOT DETECTED NOT DETECTED Final   Candida krusei NOT DETECTED NOT DETECTED Final   Candida parapsilosis NOT DETECTED NOT DETECTED Final   Candida tropicalis NOT DETECTED NOT DETECTED Final  Blood culture (routine x 2)     Status: None (Preliminary result)   Collection Time: 12/15/17  9:00 PM  Result Value Ref Range Status   Specimen Description BLOOD LEFT FOREARM  Final   Special Requests   Final    BOTTLES DRAWN AEROBIC AND ANAEROBIC Blood Culture adequate volume   Culture   Final    NO GROWTH 3 DAYS Performed at [facility], [address] [place], [place] [address]    Report Status PENDING  Incomplete   Contaminant  No abx indicated  ED Provider: Claudia DesanctisEmily Shrosbee, PA-C  Bertram MillardMichael A Jake Fuhrmann 12/19/2017, 8:59 AM Infectious Diseases Pharmacist Phone# [phone]

## 2017-12-19 NOTE — Telephone Encounter (Signed)
[facility] per Garvin FilaMike Maccia Pharm D and Shirlyn GoltzEmily Shrosbree PA

## 2017-12-20 LAB — CULTURE, BLOOD (ROUTINE X 2)
CULTURE: NO GROWTH
SPECIAL REQUESTS: ADEQUATE

## 2018-02-15 ENCOUNTER — Encounter

## 2018-02-15 ENCOUNTER — Telehealth: Payer: Self-pay | Admitting: Neurology

## 2018-02-15 ENCOUNTER — Ambulatory Visit: Payer: BLUE CROSS/BLUE SHIELD | Admitting: Neurology

## 2018-02-15 NOTE — Telephone Encounter (Signed)
This patient did not show for a new patient appointment today. 

## 2018-02-16 ENCOUNTER — Encounter: Payer: Self-pay | Admitting: Neurology

## 2018-12-03 IMAGING — CR DG CHEST 2V
2 series · 2 of 2 positions shown · non-contrast
Comparison: Chest radiograph 07/13/2015

CLINICAL DATA: Found unresponsive

EXAM:
CHEST - 2 VIEW

[chest pa]
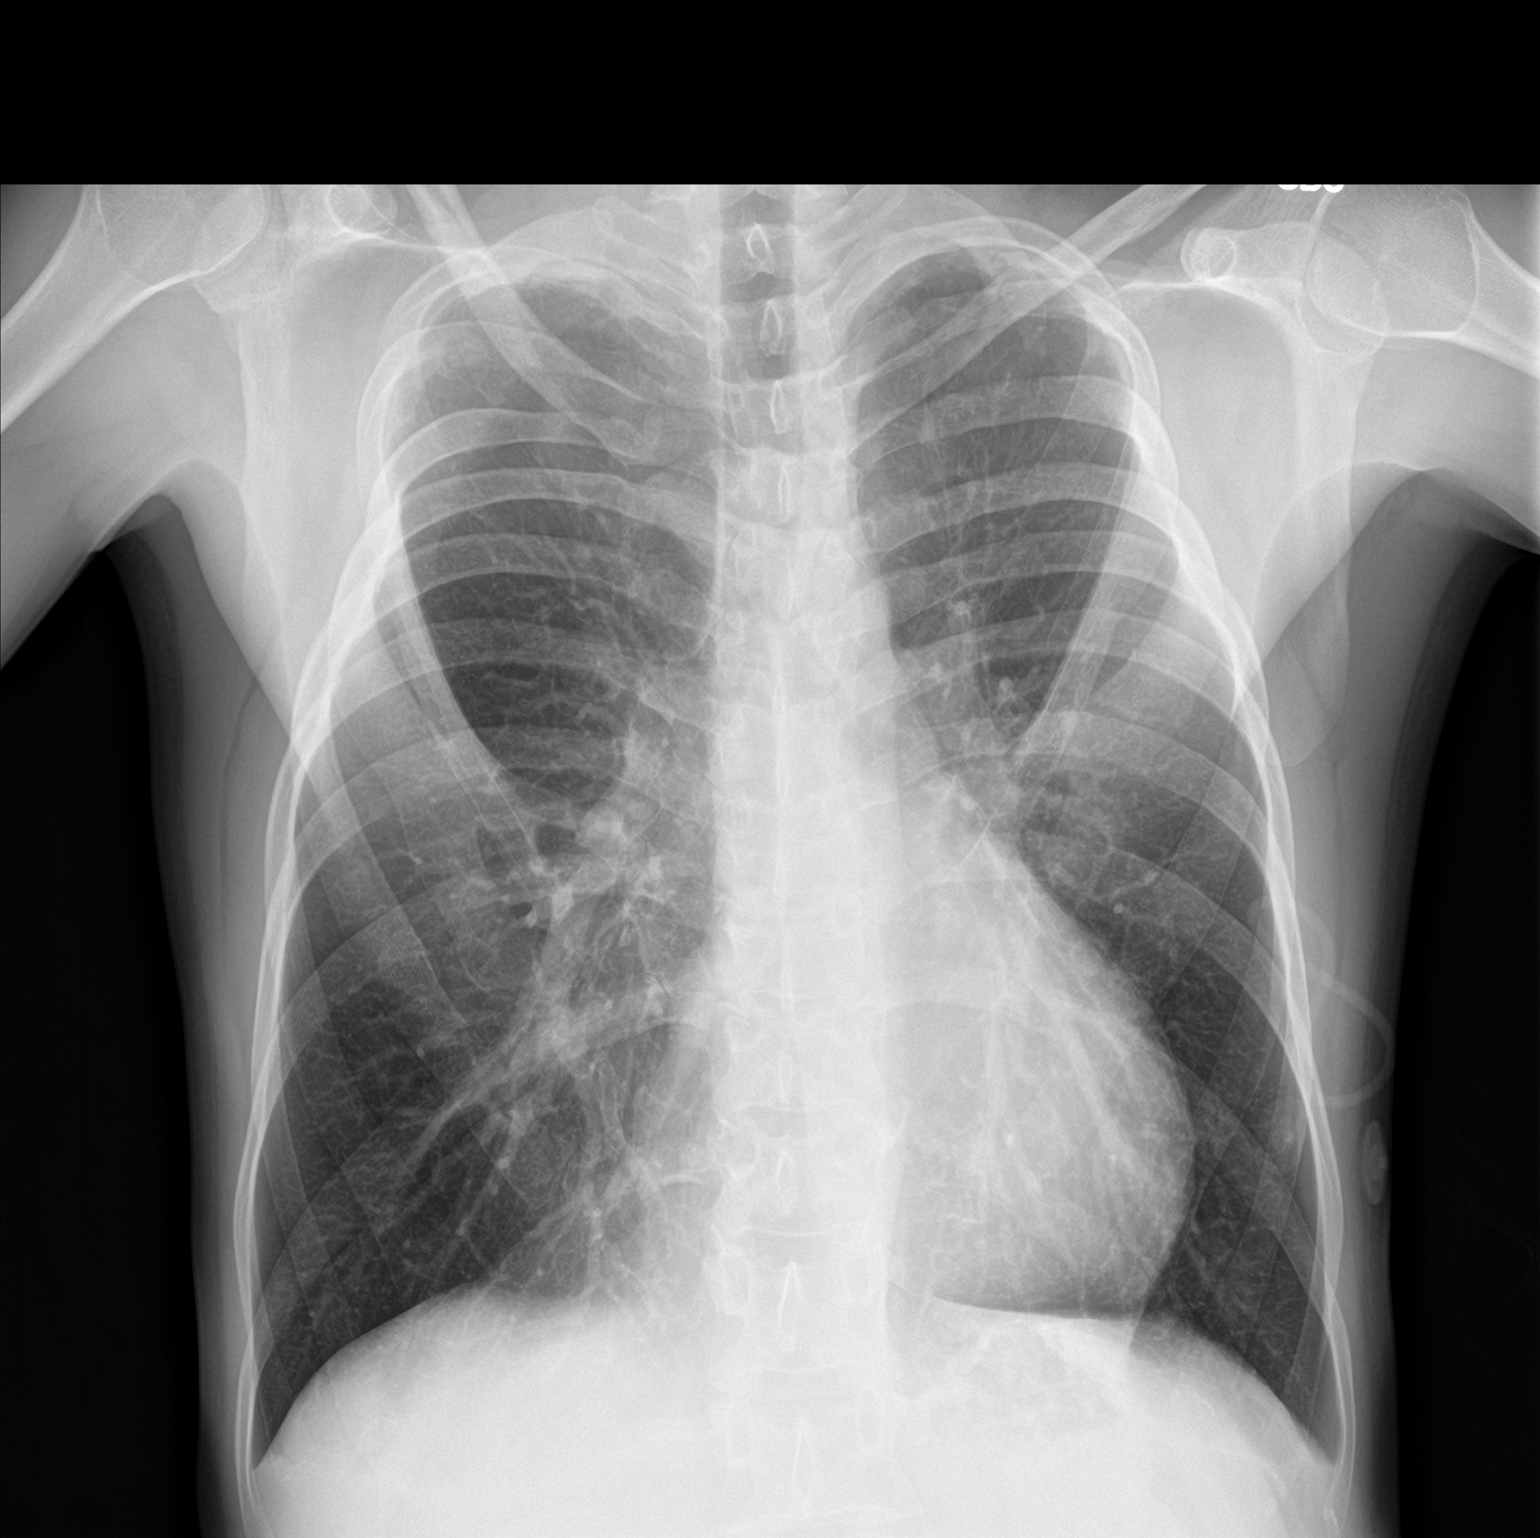

[chest lat]
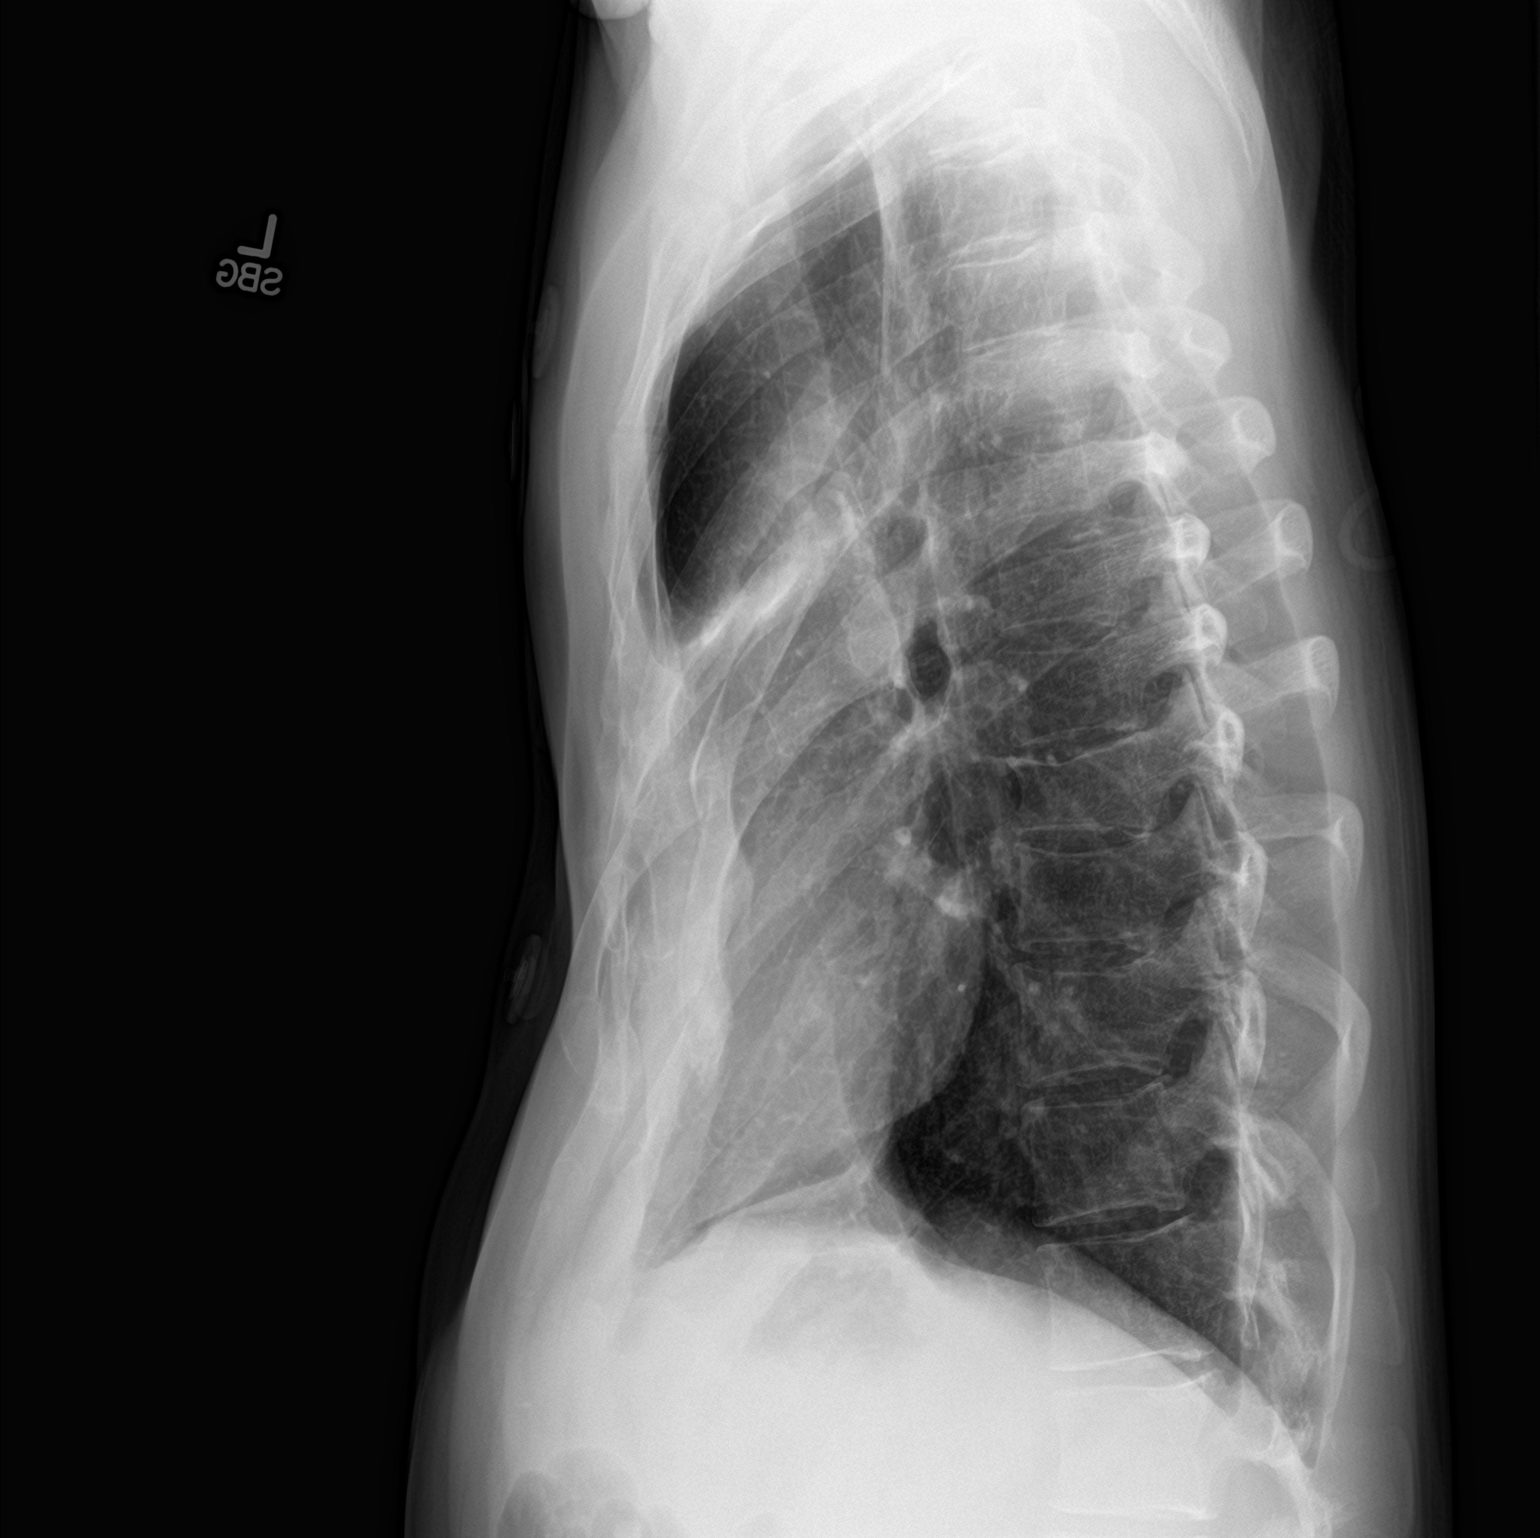

[2 of 2 positions shown; findings below may reference images not displayed]

FINDINGS: The lungs are hyperinflated. There is pectus excavatum. No focal
airspace consolidation or pulmonary edema. Normal cardiomediastinal
contours.
IMPRESSION: Hyperinflated lungs and pectus excavatum. No focal airspace disease.

## 2020-11-27 ENCOUNTER — Other Ambulatory Visit: Payer: Self-pay

## 2020-11-27 ENCOUNTER — Ambulatory Visit (HOSPITAL_COMMUNITY)
Admission: EM | Admit: 2020-11-27 | Discharge: 2020-11-27 | Disposition: A | Discharge status: Home / Self Care | Payer: 59 | Attending: Family Medicine | Admitting: Family Medicine

## 2020-11-27 ENCOUNTER — Encounter (HOSPITAL_COMMUNITY): Payer: Self-pay | Admitting: Emergency Medicine

## 2020-11-27 DIAGNOSIS — S39012A Strain of muscle, fascia and tendon of lower back, initial encounter: Secondary | ICD-10-CM | POA: Diagnosis not present

## 2020-11-27 MED ORDER — CYCLOBENZAPRINE HCL 10 MG PO TABS: 10.0000 mg | ORAL_TABLET | Freq: Every evening | ORAL | 0 refills | Status: AC | PRN
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
## 2020-12-18 ENCOUNTER — Other Ambulatory Visit: Payer: Self-pay | Admitting: Family Medicine

## 2021-01-21 ENCOUNTER — Encounter (HOSPITAL_COMMUNITY): Payer: Self-pay | Admitting: Emergency Medicine

## 2021-01-21 ENCOUNTER — Ambulatory Visit (HOSPITAL_COMMUNITY)
Admission: EM | Admit: 2021-01-21 | Discharge: 2021-01-21 | Disposition: A | Discharge status: Home / Self Care | Payer: 59 | Attending: Urgent Care | Admitting: Urgent Care

## 2021-01-21 ENCOUNTER — Other Ambulatory Visit: Payer: Self-pay

## 2021-01-21 DIAGNOSIS — H9202 Otalgia, left ear: Secondary | ICD-10-CM | POA: Diagnosis not present

## 2021-01-21 DIAGNOSIS — R42 Dizziness and giddiness: Secondary | ICD-10-CM | POA: Diagnosis not present

## 2021-01-21 DIAGNOSIS — H6983 Other specified disorders of Eustachian tube, bilateral: Secondary | ICD-10-CM | POA: Diagnosis not present

## 2021-01-21 MED ORDER — PSEUDOEPHEDRINE HCL 60 MG PO TABS: 60.0000 mg | ORAL_TABLET | Freq: Three times a day (TID) | ORAL | 0 refills | Status: AC | PRN

## 2021-01-21 MED ORDER — CETIRIZINE HCL 10 MG PO TABS: 10.0000 mg | ORAL_TABLET | Freq: Every day | ORAL | 0 refills | Status: AC

## 2021-01-21 MED ORDER — FLUTICASONE PROPIONATE 50 MCG/ACT NA SUSP: 2.0000 | Freq: Every day | NASAL | 0 refills | Status: AC

## 2021-01-21 NOTE — ED Triage Notes (Signed)
Pt presents with bilateral ear pain, dizziness, and headache that started this am

## 2021-01-21 NOTE — ED Triage Notes (Signed)
Pt called in lobby, pt was asleep and said not ready to go to exam room.

## 2021-01-21 NOTE — ED Provider Notes (Signed)
[facility]   MRN: [id number] DOB: Jun 12, 1986  Subjective:   [name] is a 35 y.o. male presenting for acute onset this morning of bilateral ear pain worse over the left, intermittent ringing, dizziness, sinus headache.  Denies fever, runny or stuffy nose, cough, sore throat, chest pain, shortness of breath, confusion, ear drainage, swelling, trauma to the ear.  Has not tried medications for relief.  Patient states that he needs a note for his job.  He does admit to having trouble with his ear tubes before, had tympanostomy as a child.  Would like recommendations for ENT.  No current facility-administered medications for this encounter.  Current Outpatient Medications:  .  cyclobenzaprine (FLEXERIL) 10 MG tablet, Take 1 tablet (10 mg total) by mouth at bedtime as needed for muscle spasms., Disp: 15 tablet, Rfl: 0 .  ibuprofen (ADVIL,MOTRIN) 600 MG tablet, Take 400 mg by mouth as needed., Disp: , Rfl:  .  ofloxacin (OCUFLOX) 0.3 % ophthalmic solution, Instill 2 drops every 4 hours for the first 2 days, then instill 2 drops 4 times daily for an additional 5 days. (Patient not taking: Reported on 12/15/2017), Disp: 5 mL, Rfl: 0   No Known Allergies  Past Medical History:  Diagnosis Date  . Pericarditis      Past Surgical History:  Procedure Laterality Date  . APPENDECTOMY    . COSMETIC SURGERY     pectus excavatum  . pericardial window  2011   for pericarditis  . TONSILLECTOMY      Family History  Problem Relation Age of Onset  . Hyperlipidemia Father   . Asthma Father   . Hyperlipidemia Paternal Grandfather   . Hypertension Paternal Grandfather   . Thyroid disease Mother   . Asthma Sister   . Colon cancer Maternal Grandmother     Social History   Tobacco Use  . Smoking status: Former Smoker    Packs/day: 0.10    Years: 9.00    Pack years: 0.90    Types: Cigarettes    Quit date: 03/28/2014    Years since quitting: 6.8  . Smokeless tobacco:  Former Neurosurgeon    Types: Snuff  Substance Use Topics  . Alcohol use: Yes    Comment: occassional  . Drug use: No    ROS   Objective:   Vitals: BP 115/72 (BP Location: Right Arm)   Pulse 86   Temp 97.8 F (36.6 C) (Oral)   Resp 16   SpO2 100%   Physical Exam Constitutional:      General: He is not in acute distress.    Appearance: Normal appearance. He is normal weight. He is not ill-appearing, toxic-appearing or diaphoretic.  HENT:     Head: Normocephalic and atraumatic.     Right Ear: Ear canal and external ear normal. There is no impacted cerumen.     Left Ear: Ear canal and external ear normal. There is no impacted cerumen.     Ears:     Comments: TMs opacified bilaterally.    Nose: Nose normal. No congestion or rhinorrhea.     Mouth/Throat:     Mouth: Mucous membranes are moist.     Pharynx: No oropharyngeal exudate or posterior oropharyngeal erythema.  Eyes:     General: No scleral icterus.       Right eye: No discharge.        Left eye: No discharge.     Extraocular Movements: Extraocular movements intact.  Conjunctiva/sclera: Conjunctivae normal.     Pupils: Pupils are equal, round, and reactive to light.  Cardiovascular:     Rate and Rhythm: Normal rate.  Pulmonary:     Effort: Pulmonary effort is normal.  Musculoskeletal:     Cervical back: Normal range of motion and neck supple. No rigidity. No muscular tenderness.  Neurological:     General: No focal deficit present.     Mental Status: He is alert and oriented to person, place, and time.     Cranial Nerves: No cranial nerve deficit.     Motor: No weakness.     Coordination: Coordination normal.     Gait: Gait normal.     Deep Tendon Reflexes: Reflexes normal.  Psychiatric:        Mood and Affect: Mood normal.        Behavior: Behavior normal.        Thought Content: Thought content normal.        Judgment: Judgment normal.     Assessment and Plan :   PDMP not reviewed this encounter.  1.  Eustachian tube dysfunction, bilateral   2. Dizziness   3. Left ear pain     Recommended conservative management for what I suspect is eustachian tube dysfunction.  Start Flonase, Zyrtec and pseudoephedrine.  Counseled on general management of dizziness and vertigo.  Recommended general health maintenance.  Follow-up with ENT. Counseled patient on potential for adverse effects with medications prescribed/recommended today, ER and return-to-clinic precautions discussed, patient verbalized understanding.    Wallis Bamberg, [place] 01/21/21 [phone]

## 2023-06-25 ENCOUNTER — Encounter (HOSPITAL_COMMUNITY): Payer: Self-pay | Admitting: Internal Medicine

## 2023-06-25 ENCOUNTER — Ambulatory Visit (HOSPITAL_COMMUNITY)
Admission: EM | Admit: 2023-06-25 | Discharge: 2023-06-25 | Disposition: A | Discharge status: Home / Self Care | Payer: Self-pay | Attending: Internal Medicine | Admitting: Internal Medicine

## 2023-06-25 ENCOUNTER — Encounter (HOSPITAL_COMMUNITY): Payer: Self-pay | Admitting: *Deleted

## 2023-06-25 ENCOUNTER — Telehealth (HOSPITAL_COMMUNITY): Payer: Self-pay | Admitting: Internal Medicine

## 2023-06-25 ENCOUNTER — Ambulatory Visit (INDEPENDENT_AMBULATORY_CARE_PROVIDER_SITE_OTHER): Payer: Self-pay

## 2023-06-25 ENCOUNTER — Telehealth (HOSPITAL_COMMUNITY): Payer: Self-pay | Admitting: *Deleted

## 2023-06-25 DIAGNOSIS — S61451A Open bite of right hand, initial encounter: Secondary | ICD-10-CM

## 2023-06-25 DIAGNOSIS — W540XXA Bitten by dog, initial encounter: Secondary | ICD-10-CM

## 2023-06-25 DIAGNOSIS — S62141A Displaced fracture of body of hamate [unciform] bone, right wrist, initial encounter for closed fracture: Secondary | ICD-10-CM

## 2023-06-25 DIAGNOSIS — Z23 Encounter for immunization: Secondary | ICD-10-CM

## 2023-06-25 MED ORDER — AMOXICILLIN-POT CLAVULANATE 875-125 MG PO TABS: 1.0000 | ORAL_TABLET | Freq: Two times a day (BID) | ORAL | 0 refills | Status: AC

## 2023-06-25 MED ORDER — TETANUS-DIPHTH-ACELL PERTUSSIS 5-2.5-18.5 LF-MCG/0.5 IM SUSY
PREFILLED_SYRINGE | INTRAMUSCULAR | Status: AC
  Filled 2023-06-25: qty 0.5

## 2023-06-25 MED ORDER — HYDROCODONE-ACETAMINOPHEN 5-325 MG PO TABS: 1.0000 | ORAL_TABLET | Freq: Four times a day (QID) | ORAL | 0 refills | Status: DC | PRN

## 2023-06-25 MED ORDER — IBUPROFEN 800 MG PO TABS: 800.0000 mg | ORAL_TABLET | Freq: Three times a day (TID) | ORAL | 0 refills | Status: DC

## 2023-06-25 MED ORDER — TETANUS-DIPHTH-ACELL PERTUSSIS 5-2.5-18.5 LF-MCG/0.5 IM SUSY
0.5000 mL | PREFILLED_SYRINGE | Freq: Once | INTRAMUSCULAR | Status: AC
  Administered 2023-06-25: 0.5 mL via INTRAMUSCULAR

## 2023-06-25 NOTE — Telephone Encounter (Signed)
[facility], [facility] not [facility]. [facility] for [facility] canceled via phone call by nursing staff. Medicine sent to [facility] instead.

## 2023-06-25 NOTE — Progress Notes (Signed)
Orthopedic Tech Progress Note Patient Details:  [name] Aug 13, 1986 [id number]  Ortho Devices Type of Ortho Device: Ulna gutter splint Ortho Device/Splint Location: RUE Ortho Device/Splint Interventions: Ordered, Application   Post Interventions Patient Tolerated: Well Instructions Provided: Care of device  Tonye Pearson 06/25/2023, 1:52 PM

## 2023-06-25 NOTE — ED Triage Notes (Signed)
Pt states he was walking his dogs last night and his brothers dog and his started fighting. He was trying to break up the fight when his right wrist and hand were injured. His right hand is swollen.    He states he was given rabies vaccines and TDAP back in 2019

## 2023-06-25 NOTE — ED Provider Notes (Signed)
[facility]    CSN: [id number] Arrival date & time: 06/25/23  1154      History   Chief Complaint Chief Complaint  Patient presents with   Animal Bite    HPI [name] is a 37 y.o. male.   [name] is a 37 y.o. male presenting for chief complaint of dog bite to the right hand/right wrist that happened last night approximately 14 hours ago. Patient's dog got into a fight with his brother's dog and patient broke up the fight with his hand. He suffered a dog bite to the right hand/wrist as a result of this by his brother's dog.  Brother's dog is up-to-date on rabies vaccination.  Currently experiencing significant pain and swelling to the dorsal aspect of the right hand as well as ulnar aspect of the right wrist.  Pain is triggered by flexion and extension at the right wrist.  Reports a little bit of numbness to the right pinky finger, otherwise sensation intact distally.  There are a few abrasion/laceration wounds to the right hand and the right wrist.  Bleeding controlled currently.  He does not take any blood thinning medications.  Able to make a fist with the right hand despite pain.  Last tetanus injection was in 2019.  He has taken 800 mg of ibuprofen this morning and is helping a little bit with the pain.  No recent antibiotic/steroid use, no history of immunosuppression.     Past Medical History:  Diagnosis Date   Pericarditis     Patient Active Problem List   Diagnosis Date Noted   Pleurisy 06/15/2014    Past Surgical History:  Procedure Laterality Date   APPENDECTOMY     COSMETIC SURGERY     pectus excavatum   pericardial window  2011   for pericarditis   TONSILLECTOMY         Home Medications    Prior to Admission medications   Medication Sig Start Date End Date Taking? Authorizing Provider  amoxicillin-clavulanate (AUGMENTIN) 875-125 MG tablet Take 1 tablet by mouth every 12 (twelve) hours. 06/25/23  Yes Carlisle Beers, FNP   HYDROcodone-acetaminophen (NORCO/VICODIN) 5-325 MG tablet Take 1 tablet by mouth every 6 (six) hours as needed for up to 3 days. 06/25/23 06/28/23 Yes Carlisle Beers, FNP  ibuprofen (ADVIL) 800 MG tablet Take 1 tablet (800 mg total) by mouth 3 (three) times daily. 06/25/23  Yes Carlisle Beers, FNP  cetirizine (ZYRTEC ALLERGY) 10 MG tablet Take 1 tablet (10 mg total) by mouth daily. 01/21/21   Wallis Bamberg, PA-C  cyclobenzaprine (FLEXERIL) 10 MG tablet Take 1 tablet (10 mg total) by mouth at bedtime as needed for muscle spasms. 11/27/20   Particia Nearing, PA-C  fluticasone [facility]) 50 MCG/ACT nasal spray Place 2 sprays into both nostrils daily. 01/21/21   Wallis Bamberg, PA-C  ofloxacin (OCUFLOX) 0.3 % ophthalmic solution Instill 2 drops every 4 hours for the first 2 days, then instill 2 drops 4 times daily for an additional 5 days. Patient not taking: Reported on 12/15/2017 11/04/16   Trena Platt D, PA  pseudoephedrine (SUDAFED) 60 MG tablet Take 1 tablet (60 mg total) by mouth every 8 (eight) hours as needed for congestion. 01/21/21   Wallis Bamberg, PA-C    Family History Family History  Problem Relation Age of Onset   Hyperlipidemia Father    Asthma Father    Hyperlipidemia Paternal Grandfather    Hypertension Paternal Grandfather  Thyroid disease Mother    Asthma Sister    Colon cancer Maternal Grandmother     Social History Social History   Tobacco Use   Smoking status: Former    Current packs/day: 0.00    Average packs/day: 0.1 packs/day for 9.0 years (0.9 ttl pk-yrs)    Types: Cigarettes    Start date: 03/28/2005    Quit date: 03/28/2014    Years since quitting: 9.2   Smokeless tobacco: Former    Types: Snuff  Vaping Use   Vaping status: Never Used  Substance Use Topics   Alcohol use: Yes    Comment: occassional   Drug use: No     Allergies   Patient has no known allergies.   Review of Systems Review of Systems Per HPI  Physical Exam Triage Vital  Signs ED Triage Vitals  Encounter Vitals Group     BP 06/25/23 1217 (!) 146/93     Systolic BP Percentile --      Diastolic BP Percentile --      Pulse Rate 06/25/23 1217 89     Resp 06/25/23 1217 18     Temp 06/25/23 1217 98.7 F (37.1 C)     Temp Source 06/25/23 1217 Oral     SpO2 06/25/23 1217 97 %     Weight --      Height --      Head Circumference --      Peak Flow --      Pain Score 06/25/23 1215 6     Pain Loc --      Pain Education --      Exclude from Growth Chart --    No data found.  Updated Vital Signs BP (!) 146/93 (BP Location: Left Arm)   Pulse 89   Temp 98.7 F (37.1 C) (Oral)   Resp 18   SpO2 97%   Visual Acuity Right Eye Distance:   Left Eye Distance:   Bilateral Distance:    Right Eye Near:   Left Eye Near:    Bilateral Near:     Physical Exam Vitals and nursing note reviewed.  Constitutional:      Appearance: He is not ill-appearing or toxic-appearing.  HENT:     Head: Normocephalic and atraumatic.     Right Ear: Hearing and external ear normal.     Left Ear: Hearing and external ear normal.     Nose: Nose normal.     Mouth/Throat:     Lips: Pink.  Eyes:     General: Lids are normal. Vision grossly intact. Gaze aligned appropriately.     Extraocular Movements: Extraocular movements intact.     Conjunctiva/sclera: Conjunctivae normal.  Pulmonary:     Effort: Pulmonary effort is normal.  Musculoskeletal:     Right wrist: Swelling, deformity (Slight deformity to the ulnar aspect of the right wrist with overlying tenderness palpation), laceration and tenderness (TTP to the ulnar aspect of the right wrist, tenderness elicited with range of motion activity) present. No effusion, snuff box tenderness or crepitus. Decreased range of motion (Secondary to pain). Normal pulse (+2 right radial pulse).     Left wrist: Normal.     Right hand: Swelling (Diffuse soft tissue swelling to the dorsum of the right hand), laceration (A few abrasions over the  dorsum of the right hand as seen in images below.  Bleeding controlled.) and tenderness (Diffuse secondary to swelling and abrasions) present. No deformity or bony tenderness. Decreased range of motion (  Secondary to pain, moves all 5 fingers of right hand voluntarily without difficulty.). Decreased strength (4/5 grip strength to the right hand secondary to pain and swelling). Normal sensation (Sensation intact to distal digits of the right hand). Normal capillary refill (Less than 2 cap refill). Normal pulse.     Left hand: Normal.     Cervical back: Neck supple.  Skin:    General: Skin is warm and dry.     Capillary Refill: Capillary refill takes less than 2 seconds.     Findings: No rash.  Neurological:     General: No focal deficit present.     Mental Status: He is alert and oriented to person, place, and time. Mental status is at baseline.     Cranial Nerves: No dysarthria or facial asymmetry.  Psychiatric:        Mood and Affect: Mood normal.        Speech: Speech normal.        Behavior: Behavior normal.        Thought Content: Thought content normal.        Judgment: Judgment normal.    Images of right hand/right wrist        UC Treatments / Results  Labs (all labs ordered are listed, but only abnormal results are displayed) Labs Reviewed - No data to display  EKG   Radiology DG Wrist Complete Right  Result Date: 06/25/2023 CLINICAL DATA:  Dog bite injuring right hand and wrist.  Swelling. EXAM: RIGHT WRIST - COMPLETE 3+ VIEW; RIGHT HAND - COMPLETE 3+ VIEW COMPARISON:  None Available. FINDINGS: Wrist: Mildly displaced fracture through the ulnar aspect of the hamate. No additional fracture of the wrist. The alignment is normal. No dislocation. There is soft tissue edema most prominent dorsally with small foci of gas. No radiopaque foreign body. Hand: Hamate fracture again seen. No additional fracture of the hand. Normal alignment and joint spaces. Dorsal soft tissue edema  with small foci of gas. No radiopaque foreign body. There are tiny densities in the skin adjacent to the thumb interphalangeal joint which may be chronic. IMPRESSION: 1. Mildly displaced fracture through the ulnar aspect of the hamate. 2. Dorsal soft tissue edema with small foci of gas. No radiopaque foreign body. 3. Tiny densities in the skin adjacent to the thumb interphalangeal joint which may be chronic. Electronically Signed   By: Narda Rutherford M.D.   On: 06/25/2023 13:07   DG Hand Complete Right  Result Date: 06/25/2023 CLINICAL DATA:  Dog bite injuring right hand and wrist.  Swelling. EXAM: RIGHT WRIST - COMPLETE 3+ VIEW; RIGHT HAND - COMPLETE 3+ VIEW COMPARISON:  None Available. FINDINGS: Wrist: Mildly displaced fracture through the ulnar aspect of the hamate. No additional fracture of the wrist. The alignment is normal. No dislocation. There is soft tissue edema most prominent dorsally with small foci of gas. No radiopaque foreign body. Hand: Hamate fracture again seen. No additional fracture of the hand. Normal alignment and joint spaces. Dorsal soft tissue edema with small foci of gas. No radiopaque foreign body. There are tiny densities in the skin adjacent to the thumb interphalangeal joint which may be chronic. IMPRESSION: 1. Mildly displaced fracture through the ulnar aspect of the hamate. 2. Dorsal soft tissue edema with small foci of gas. No radiopaque foreign body. 3. Tiny densities in the skin adjacent to the thumb interphalangeal joint which may be chronic. Electronically Signed   By: Narda Rutherford M.D.   On: 06/25/2023 13:07  Procedures Procedures (including critical care time)  Medications Ordered in UC Medications  Tdap (BOOSTRIX) injection 0.5 mL (0.5 mLs Intramuscular Given 06/25/23 1239)    Initial Impression / Assessment and Plan / UC Course  I have reviewed the triage vital signs and the nursing notes.  Pertinent labs & imaging results that were available during my  care of the patient were reviewed by me and considered in my medical decision making (see chart for details).   1.  Dog bite of right hand, need for tetanus booster, closed displaced fracture of hamate of right wrist, unspecified portion of hamate Dog bite- dog rabies up to date. Tetanus updated today.  X-ray shows closed fracture of the right hamate.   Ulnar gutter splint placed by ortho tech, good cap refill distally post splint placement.  Discussed splint care (avoid getting wet, etc), patient to keep splint on until ortho follow-up. RICE advised. Information for orthopedic follow-up given, advised to schedule appointment for the next 3-5 days for follow-up.   May use ibuprofen at home as needed for pain. May also use hydrocodone-acetaminophen as needed for severe breakthrough pain at home.  PDMP reviewed. Advised to use strong narcotic pain medicine sparingly and only for severe/breakthrough pain that is not well controlled with ibuprofen.   Consulted [facility] to discuss need for IV antibiotics given location of wound and fracture. He recommends Augmentin BID for 7 days, ulnar gutter, and follow-up with hand.   Counseled patient on potential for adverse effects with medications prescribed/recommended today, strict ER and return-to-clinic precautions discussed, patient verbalized understanding.    Final Clinical Impressions(s) / UC Diagnoses   Final diagnoses:  Dog bite of right hand, initial encounter  Need for tetanus booster     Discharge Instructions      You have fractured your right hand (hamate bone). We placed your hand/wrist in a splint, avoid getting the splint wet. Wear splint at all times and do not remove it until your orthopedic follow-up appointment.  Rest, ice, elevate, and compress the injury to reduce swelling and inflammation.   Please take ibuprofen 800mg  every 8 hours and/or tylenol regular strength 650mg  every 6 hours as needed with food for  pain. You may purchase these medications over the counter (4 200mg  ibuprofen pills= 800mg ).   If you still have pain despite taking ibuprofen regularly, this is called breakthrough pain.  You can use hydrocodone, a narcotic pain medicine, once every 4-6 hours for this.  Once your pain is better controlled, switch back to just ibuprofen/tylenol.  Avoid use of extra strength tylenol while taking hydrocodone since this medication already has tylenol in it.   Schedule an appointment with the orthopedic provider listed on your paperwork for follow-up in the next 3-5 days.  Take antibiotic as prescribed for 7 days to prevent infection to the bite wound.  Watch for worsening signs of infection such as redness, swelling, warmth, or pain to the site.   Return if you experience worsening pain, numbness, tingling, skin color changes, or any other concerning symptoms. If symptoms are severe, please go to the ER. I hope you feel better!!      ED Prescriptions     Medication Sig Dispense Auth. Provider   amoxicillin-clavulanate (AUGMENTIN) 875-125 MG tablet Take 1 tablet by mouth every 12 (twelve) hours. 14 tablet Reita May M, FNP   ibuprofen (ADVIL) 800 MG tablet Take 1 tablet (800 mg total) by mouth 3 (three) times daily. 21 tablet Lasharon Dunivan,  Donavan Burnet, FNP   HYDROcodone-acetaminophen (NORCO/VICODIN) 5-325 MG tablet Take 1 tablet by mouth every 6 (six) hours as needed for up to 3 days. 10 tablet Carlisle Beers, FNP      I have reviewed the PDMP during this encounter.   Carlisle Beers, [place] 06/25/23 1354

## 2023-06-25 NOTE — Telephone Encounter (Signed)
[facility] 5/325. He would like it sent to [facility] [address]. I called that pharmacy and they do have the med in stock.

## 2023-06-25 NOTE — Discharge Instructions (Addendum)
You have fractured your right hand (hamate bone). We placed your hand/wrist in a splint, avoid getting the splint wet. Wear splint at all times and do not remove it until your orthopedic follow-up appointment.  Rest, ice, elevate, and compress the injury to reduce swelling and inflammation.   Please take ibuprofen 800mg  every 8 hours and/or tylenol regular strength 650mg  every 6 hours as needed with food for pain. You may purchase these medications over the counter (4 200mg  ibuprofen pills= 800mg ).   If you still have pain despite taking ibuprofen regularly, this is called breakthrough pain.  You can use hydrocodone, a narcotic pain medicine, once every 4-6 hours for this.  Once your pain is better controlled, switch back to just ibuprofen/tylenol.  Avoid use of extra strength tylenol while taking hydrocodone since this medication already has tylenol in it.   Schedule an appointment with the orthopedic provider listed on your paperwork for follow-up in the next 3-5 days.  Take antibiotic as prescribed for 7 days to prevent infection to the bite wound.  Watch for worsening signs of infection such as redness, swelling, warmth, or pain to the site.   Return if you experience worsening pain, numbness, tingling, skin color changes, or any other concerning symptoms. If symptoms are severe, please go to the ER. I hope you feel better!!

## 2023-06-30 ENCOUNTER — Other Ambulatory Visit: Payer: Self-pay | Admitting: Orthopedic Surgery

## 2023-06-30 ENCOUNTER — Ambulatory Visit (INDEPENDENT_AMBULATORY_CARE_PROVIDER_SITE_OTHER): Payer: Self-pay | Admitting: Orthopedic Surgery

## 2023-06-30 ENCOUNTER — Telehealth: Payer: Self-pay | Admitting: Orthopedic Surgery

## 2023-06-30 ENCOUNTER — Other Ambulatory Visit (INDEPENDENT_AMBULATORY_CARE_PROVIDER_SITE_OTHER): Payer: Self-pay

## 2023-06-30 DIAGNOSIS — M79641 Pain in right hand: Secondary | ICD-10-CM

## 2023-06-30 DIAGNOSIS — S62141A Displaced fracture of body of hamate [unciform] bone, right wrist, initial encounter for closed fracture: Secondary | ICD-10-CM

## 2023-06-30 MED ORDER — IBUPROFEN 800 MG PO TABS: 800.0000 mg | ORAL_TABLET | Freq: Three times a day (TID) | ORAL | 0 refills | Status: DC

## 2023-06-30 MED ORDER — HYDROCODONE-ACETAMINOPHEN 5-325 MG PO TABS: 1.0000 | ORAL_TABLET | Freq: Four times a day (QID) | ORAL | 0 refills | Status: AC | PRN

## 2023-06-30 NOTE — Telephone Encounter (Signed)
[facility] Dr[facility] Fara Boros would like him to come back in 3 weeks no avail appt and he is out of the office after that week please advise

## 2023-06-30 NOTE — Progress Notes (Signed)
[name] - 37 y.o. male MRN [id number]  Date of birth: 03/11/1986  Office Visit Note: Visit Date: 06/30/2023 PCP: Jonita Albee, MD Referred by: Jonita Albee, MD  Subjective: No chief complaint on file.  HPI: [name] is a pleasant 37 y.o. male who presents today for evaluation of a right hand injury sustained on September 27.  Mechanism was a dog bite, was his own dog, appropriately vaccinated and up-to-date.  He was seen in the urgent care setting the day after injury, underwent x-rays which showed dorsal ulnar hamate fracture, he was placed to a splint and given orthopedic follow-up.  He has also been on antibiotics for the dog bite.  Wound has been clean and dry, no significant purulence or erythema in the area.  Denies any significant numbness or tingling.  Pertinent ROS were reviewed with the patient and found to be negative unless otherwise specified above in HPI.   Visit Reason: right hand/ dog bite Duration of symptoms:Sept 27 Hand dominance: right Occupation: unemployed Diabetic: No Smoking: No Heart/Lung History:none Blood Thinners: none   Assessment & Plan: Visit Diagnoses:  1. Pain in right hand     Plan: X-rays from the urgent care setting were reviewed, new x-rays were obtained today which do show a dorsal ulnar aspect fracture of the hamate.  This does not appear to involve the hook, there is no significant neurovascular compromise seen on clinical examination today.  His wound appears to be well-healing.  We can continue with nonoperative treatment in the form of immobilization for the time being.  I did explain that there could be an element of not healing or nonunion to the hamate fracture that may require excision in the future.  He expressed full understanding.  I will have him follow-up in approximate 3 weeks time for repeat clinical and radiographic check.  Complete antibiotics as prescribed.  Additional pain medication was given today.  We  also discussed using over-the-counter nonsteroidal anti-inflammatories as well as Tylenol moving forward.  Follow-up: No follow-ups on file.   Meds & Orders: No orders of the defined types were placed in this encounter.   Orders Placed This Encounter  Procedures   XR Wrist 2 Views Right     Procedures: No procedures performed      Clinical History: No specialty comments available.  He reports that he quit smoking about 9 years ago. His smoking use included cigarettes. He started smoking about 18 years ago. He has a 0.9 pack-year smoking history. He has quit using smokeless tobacco.  His smokeless tobacco use included snuff. No results for input(s): "HGBA1C", "LABURIC" in the last 8760 hours.  Objective:   Vital Signs: There were no vitals taken for this visit.  Physical Exam  Gen: Well-appearing, in no acute distress; non-toxic CV: Regular Rate. Well-perfused. Warm.  Resp: Breathing unlabored on room air; no wheezing. Psych: Fluid speech in conversation; appropriate affect; normal thought process  Ortho Exam Right hand: - Small puncture wound over the ulnar aspect of the hand, no active drainage, no significant erythema - Able to perform range of motion of the digits without significant restriction - Sensation is intact distally median/radial/ulnar - Appropriate capillary refill to the digits  Imaging: XR Wrist 2 Views Right  Result Date: 06/30/2023 X-rays of the right wrist, multiple views were obtained today X-rays demonstrate dorsal ulnar fracture of the hamate body with displacement, carpometacarpal articulations at the fourth and fifth articulations are appropriate and well  located.   Past Medical/Family/Surgical/Social History: Medications & Allergies reviewed per EMR, new medications updated. Patient Active Problem List   Diagnosis Date Noted   Pleurisy 06/15/2014   Past Medical History:  Diagnosis Date   Pericarditis    Family History  Problem Relation Age  of Onset   Hyperlipidemia Father    Asthma Father    Hyperlipidemia Paternal Grandfather    Hypertension Paternal Grandfather    Thyroid disease Mother    Asthma Sister    Colon cancer Maternal Grandmother    Past Surgical History:  Procedure Laterality Date   APPENDECTOMY     COSMETIC SURGERY     pectus excavatum   pericardial window  2011   for pericarditis   TONSILLECTOMY     Social History   Occupational History   Occupation: bartender    Employer: [employer]  Tobacco Use   Smoking status: Former    Current packs/day: 0.00    Average packs/day: 0.1 packs/day for 9.0 years (0.9 ttl pk-yrs)    Types: Cigarettes    Start date: 03/28/2005    Quit date: 03/28/2014    Years since quitting: 9.2   Smokeless tobacco: Former    Types: Snuff  Vaping Use   Vaping status: Never Used  Substance and Sexual Activity   Alcohol use: Yes    Comment: occassional   Drug use: No   Sexual activity: Yes    Birth control/protection: Condom    Lacrecia Delval Fara Boros) Denese Killings, M.D. [facility] 10:28 AM

## 2023-07-21 ENCOUNTER — Other Ambulatory Visit (INDEPENDENT_AMBULATORY_CARE_PROVIDER_SITE_OTHER): Payer: Self-pay

## 2023-07-21 ENCOUNTER — Ambulatory Visit (INDEPENDENT_AMBULATORY_CARE_PROVIDER_SITE_OTHER): Payer: Self-pay | Admitting: Orthopedic Surgery

## 2023-07-21 ENCOUNTER — Other Ambulatory Visit: Payer: Self-pay | Admitting: Orthopedic Surgery

## 2023-07-21 DIAGNOSIS — M79641 Pain in right hand: Secondary | ICD-10-CM

## 2023-07-21 MED ORDER — IBUPROFEN 800 MG PO TABS: 800.0000 mg | ORAL_TABLET | Freq: Three times a day (TID) | ORAL | 0 refills | Status: AC

## 2023-07-21 NOTE — Progress Notes (Signed)
[name] - 37 y.o. male MRN [id number]  Date of birth: October 24, 1985  Office Visit Note: Visit Date: 07/21/2023 PCP: Jonita Albee, MD Referred by: Jonita Albee, MD  Subjective: No chief complaint on file.  HPI: [name] is a pleasant 37 y.o. male who presents today for follow-up of a right hand injury sustained on September 27.  Mechanism was a dog bite, was his own dog, appropriately vaccinated and up-to-date.  He was seen in the urgent care setting the day after injury, underwent x-rays which showed dorsal ulnar hamate fracture, he was placed to a splint and given orthopedic follow-up.    Since his last visit, he has been compliant with the resplinting as instructed.  Pain is well-controlled, is interested in returning back to work at this time.  Has been utilizing over-the-counter Advil for pain.  Pertinent ROS were reviewed with the patient and found to be negative unless otherwise specified above in HPI.     Assessment & Plan: Visit Diagnoses:  1. Pain in right hand     Plan: X-rays were obtained today which do show a dorsal ulnar aspect fracture of the hamate with appropriate interval-healing.  We can continue with nonoperative treatment in the form of immobilization for the time being.  I will have him follow-up in approximately 4 weeks time for repeat x-ray and clinical check.  Work note was provided today which will allow him to work, I have advised that he utilize the wrist brace at all times while at work and avoid any heavy lifting with the right hand.  Follow-up: No follow-ups on file.   Meds & Orders: No orders of the defined types were placed in this encounter.   Orders Placed This Encounter  Procedures   XR Hand Complete Right     Procedures: No procedures performed      Clinical History: No specialty comments available.  He reports that he quit smoking about 9 years ago. His smoking use included cigarettes. He started smoking about 18 years  ago. He has a 0.9 pack-year smoking history. He has quit using smokeless tobacco.  His smokeless tobacco use included snuff. No results for input(s): "HGBA1C", "LABURIC" in the last 8760 hours.  Objective:   Vital Signs: There were no vitals taken for this visit.  Physical Exam  Gen: Well-appearing, in no acute distress; non-toxic CV: Regular Rate. Well-perfused. Warm.  Resp: Breathing unlabored on room air; no wheezing. Psych: Fluid speech in conversation; appropriate affect; normal thought process  Ortho Exam Right hand: - Small puncture wound over the ulnar aspect of the hand well-healed, slight firmness beneath the healing site, likely scar tissue - Able to perform range of motion of the digits without significant restriction - Sensation is intact distally median/ulnar distributions, slightly diminished in over the dorsal aspect of the hand to light touch - Appropriate capillary refill to the digits  Imaging: XR Hand Complete Right  Result Date: 07/21/2023 Dorsal hamate fracture once again visualized, there is notable interval healing with callus formation seen on multiple views.  No interval displacement.   Past Medical/Family/Surgical/Social History: Medications & Allergies reviewed per EMR, new medications updated. Patient Active Problem List   Diagnosis Date Noted   Pleurisy 06/15/2014   Past Medical History:  Diagnosis Date   Pericarditis    Family History  Problem Relation Age of Onset   Hyperlipidemia Father    Asthma Father    Hyperlipidemia Paternal Grandfather    Hypertension  Paternal Grandfather    Thyroid disease Mother    Asthma Sister    Colon cancer Maternal Grandmother    Past Surgical History:  Procedure Laterality Date   APPENDECTOMY     COSMETIC SURGERY     pectus excavatum   pericardial window  2011   for pericarditis   TONSILLECTOMY     Social History   Occupational History   Occupation: bartender    Employer: [employer]  Tobacco  Use   Smoking status: Former    Current packs/day: 0.00    Average packs/day: 0.1 packs/day for 9.0 years (0.9 ttl pk-yrs)    Types: Cigarettes    Start date: 03/28/2005    Quit date: 03/28/2014    Years since quitting: 9.3   Smokeless tobacco: Former    Types: Snuff  Vaping Use   Vaping status: Never Used  Substance and Sexual Activity   Alcohol use: Yes    Comment: occassional   Drug use: No   Sexual activity: Yes    Birth control/protection: Condom    Jacilyn Sanpedro Fara Boros) Denese Killings, M.D. [facility] 8:57 AM

## 2024-05-19 ENCOUNTER — Ambulatory Visit (HOSPITAL_COMMUNITY): Payer: Self-pay
